# Patient Record
Sex: Female | Born: 1950 | Race: Black or African American | Hispanic: No | Smoking: Never smoker
Health system: Southern US, Community
[De-identification: ages and names within clinical notes are randomized; demographics above are authoritative.]

## PROBLEM LIST (undated history)

## (undated) DIAGNOSIS — E039 Hypothyroidism, unspecified: Secondary | ICD-10-CM

## (undated) DIAGNOSIS — M1990 Unspecified osteoarthritis, unspecified site: Secondary | ICD-10-CM

## (undated) DIAGNOSIS — U071 COVID-19: Secondary | ICD-10-CM

## (undated) DIAGNOSIS — C801 Malignant (primary) neoplasm, unspecified: Secondary | ICD-10-CM

## (undated) DIAGNOSIS — I10 Essential (primary) hypertension: Secondary | ICD-10-CM

## (undated) DIAGNOSIS — D649 Anemia, unspecified: Secondary | ICD-10-CM

## (undated) DIAGNOSIS — Z9889 Other specified postprocedural states: Secondary | ICD-10-CM

## (undated) DIAGNOSIS — I82409 Acute embolism and thrombosis of unspecified deep veins of unspecified lower extremity: Secondary | ICD-10-CM

## (undated) DIAGNOSIS — R112 Nausea with vomiting, unspecified: Secondary | ICD-10-CM

## (undated) DIAGNOSIS — R7303 Prediabetes: Secondary | ICD-10-CM

## (undated) HISTORY — PX: CHOLECYSTECTOMY: SHX55

## (undated) HISTORY — PX: THYROIDECTOMY: SHX17

---

## 2019-11-30 ENCOUNTER — Other Ambulatory Visit: Payer: Self-pay | Admitting: Pediatrics

## 2019-11-30 ENCOUNTER — Other Ambulatory Visit: Payer: Self-pay | Admitting: Podiatry

## 2019-12-23 NOTE — Patient Instructions (Signed)
Jordan [name]  12/23/2019     @PREFPERIOPPHARMACY @   Your procedure is scheduled on  12/28/2019 .  Report to [facility] at  [phone]   A.M.  Call this number if you have problems the morning of surgery:  [phone]   Remember:  Do not eat or drink after midnight.                       Take these medicines the morning of surgery with A SIP OF WATER  Levothyroxine, mobic(if needed).    Do not wear jewelry, make-up or nail polish.  Do not wear lotions, powders, or perfumes. Please wear deodorant and brush your teeth.  Do not shave 48 hours prior to surgery.  Men may shave face and neck.  Do not bring valuables to the hospital.  [facility] is not responsible for any belongings or valuables.  Contacts, dentures or bridgework may not be worn into surgery.  Leave your suitcase in the car.  After surgery it may be brought to your room.  For patients admitted to the hospital, discharge time will be determined by your treatment team.  Patients discharged the day of surgery will not be allowed to drive home.   Name and phone number of your driver:   family Special instructions:  DO NOT smoke the morning of your procedure.  Please read over the following fact sheets that you were given. Anesthesia Post-op Instructions and Care and Recovery After Surgery       Toe Deformity Repair, Care After This sheet gives you information about how to care for yourself after your procedure. Your health care provider may also give you more specific instructions. If you have problems or questions, contact your health care provider. What can I expect after the procedure? After the procedure, it is common to have:  Pain in the affected area.  Discomfort with walking. Follow these instructions at home: If you have a post-operative shoe:   Wear the shoe as told by your health care provider. Remove it only as told by your health care provider.  Loosen the shoe if your toes tingle,  become numb, or turn cold and blue.  Keep the shoe clean and dry. Bathing  Do not take baths, swim, or use a hot tub until your health care provider approves. Ask your health care provider if you can take showers. You may only be allowed to take sponge baths.  If your post-operative shoe is not waterproof, cover it with a watertight covering when you take a bath or a shower.  Keep the bandage (dressing) dry until your health care provider says it can be removed. Incision care   Follow instructions from your health care provider about how to take care of your incision. Make sure you: ? Wash your hands with soap and water before you change your dressing. If soap and water are not available, use hand sanitizer. ? Change your dressing as told by your health care provider. ? Leave stitches (sutures), skin glue, or adhesive strips in place. These skin closures may need to stay in place for 2 weeks or longer.  Check your incision area every day for signs of infection. Check for: ? Redness, swelling, or pain. ? Fluid or blood. ? Warmth. ? Pus or a bad smell. Managing pain, stiffness, and swelling   If directed, put ice on the affected area. ? Put ice in a plastic bag. ?  Place a towel between your skin and the bag. ? Leave the ice on for 20 minutes, 2-3 times a day.  Raise (elevate) the affected foot above the level of your heart while you are sitting or lying down. Driving  Do not drive or use heavy machinery while taking prescription pain medicine.  Ask your health care provider when it is safe to drive if you have a post-operative shoe on your foot. Activity  Walk and return to your normal activities as told by your health care provider. Ask your health care provider what activities are safe for you.  Do not use your affected foot to support your body weight until your health care provider says that you can. Use crutches as directed by your health care provider.  Do exercises as  told by your health care provider or physical therapist. General instructions  Take over-the-counter and prescription medicines only as told by your health care provider.  If you are taking prescription pain medicine, take actions to prevent or treat constipation. Your health care provider may recommend that you: ? Drink enough fluid to keep your urine pale yellow. ? Eat foods that are high in fiber, such as fresh fruits and vegetables, whole grains, and beans. ? Limit foods that are high in fat and processed sugars, such as fried or sweet foods. ? Take an over-the-counter or prescription medicine for constipation.  Do not use any products that contain nicotine or tobacco, such as cigarettes and e-cigarettes. These can delay bone healing. If you need help quitting, ask your health care provider.  Keep all follow-up visits as told by your health care provider. This is important. Contact a health care provider if:  You have redness, swelling, or pain at your incision site.  You have fluid or blood coming from your incision.  Your incision feels warm to the touch.  You have pus or a bad smell coming from the incision area or the dressing.  You have a fever. Get help right away if:  You develop a rash.  You have difficulty breathing. Summary  After the procedure, it is common to have pain in the affected area and discomfort with walking.  Follow instructions from your health care provider about how to take care of your incision.  Walk and return to your normal activities as told by your health care provider. Ask your health care provider what activities are safe for you.  Keep all follow-up visits as told by your health care provider. This information is not intended to replace advice given to you by your health care provider. Make sure you discuss any questions you have with your health care provider. Document Revised: 10/20/2018 Document Reviewed: 03/10/2017 Elsevier Patient  Education  2020 [place] After These instructions provide you with information about caring for yourself after your procedure. Your health care provider may also give you more specific instructions. Your treatment has been planned according to current medical practices, but problems sometimes occur. Call your health care provider if you have any problems or questions after your procedure. What can I expect after the procedure? After your procedure, you may:  Feel sleepy for several hours.  Feel clumsy and have poor balance for several hours.  Feel forgetful about what happened after the procedure.  Have poor judgment for several hours.  Feel nauseous or vomit.  Have a sore throat if you had a breathing tube during the procedure. Follow these instructions at home: For  at least 24 hours after the procedure:      Have a responsible adult stay with you. It is important to have someone help care for you until you are awake and alert.  Rest as needed.  Do not: ? Participate in activities in which you could fall or become injured. ? Drive. ? Use heavy machinery. ? Drink alcohol. ? Take sleeping pills or medicines that cause drowsiness. ? Make important decisions or sign legal documents. ? Take care of children on your own. Eating and drinking  Follow the diet that is recommended by your health care provider.  If you vomit, drink water, juice, or soup when you can drink without vomiting.  Make sure you have little or no nausea before eating solid foods. General instructions  Take over-the-counter and prescription medicines only as told by your health care provider.  If you have sleep apnea, surgery and certain medicines can increase your risk for breathing problems. Follow instructions from your health care provider about wearing your sleep device: ? Anytime you are sleeping, including during daytime naps. ? While taking prescription  pain medicines, sleeping medicines, or medicines that make you drowsy.  If you smoke, do not smoke without supervision.  Keep all follow-up visits as told by your health care provider. This is important. Contact a health care provider if:  You keep feeling nauseous or you keep vomiting.  You feel light-headed.  You develop a rash.  You have a fever. Get help right away if:  You have trouble breathing. Summary  For several hours after your procedure, you may feel sleepy and have poor judgment.  Have a responsible adult stay with you for at least 24 hours or until you are awake and alert. This information is not intended to replace advice given to you by your health care provider. Make sure you discuss any questions you have with your health care provider. Document Revised: 09/28/2017 Document Reviewed: 10/21/2015 Elsevier Patient Education  [place]. How to Use Chlorhexidine for Bathing Chlorhexidine gluconate (CHG) is a germ-killing (antiseptic) solution that is used to clean the skin. It can get rid of the bacteria that normally live on the skin and can keep them away for about 24 hours. To clean your skin with CHG, you may be given:  A CHG solution to use in the shower or as part of a sponge bath.  A prepackaged cloth that contains CHG. Cleaning your skin with CHG may help lower the risk for infection:  While you are staying in the intensive care unit of the hospital.  If you have a vascular access, such as a central line, to provide short-term or long-term access to your veins.  If you have a catheter to drain urine from your bladder.  If you are on a ventilator. A ventilator is a machine that helps you breathe by moving air in and out of your lungs.  After surgery. What are the risks? Risks of using CHG include:  A skin reaction.  Hearing loss, if CHG gets in your ears.  [name] injury, if CHG gets in your eyes and is not rinsed out.  The CHG product  catching fire. Make sure that you avoid smoking and flames after applying CHG to your skin. Do not use CHG:  If you have a chlorhexidine allergy or have previously reacted to chlorhexidine.  On babies younger than 15 months of age. How to use CHG solution  Use CHG only as told by your health  care provider, and follow the instructions on the label.  Use the full amount of CHG as directed. Usually, this is one bottle. During a shower Follow these steps when using CHG solution during a shower (unless your health care provider gives you different instructions): 1. Start the shower. 2. Use your normal soap and shampoo to wash your face and hair. 3. Turn off the shower or move out of the shower stream. 4. Pour the CHG onto a clean washcloth. Do not use any type of brush or rough-edged sponge. 5. Starting at your neck, lather your body down to your toes. Make sure you follow these instructions: ? If you will be having surgery, pay special attention to the part of your body where you will be having surgery. Scrub this area for at least 1 minute. ? Do not use CHG on your head or face. If the solution gets into your ears or eyes, rinse them well with water. ? Avoid your genital area. ? Avoid any areas of skin that have broken skin, cuts, or scrapes. ? Scrub your back and under your arms. Make sure to wash skin folds. 6. Let the lather sit on your skin for 1-2 minutes or as long as told by your health care provider. 7. Thoroughly rinse your entire body in the shower. Make sure that all body creases and crevices are rinsed well. 8. Dry off with a clean towel. Do not put any substances on your body afterward--such as powder, lotion, or perfume--unless you are told to do so by your health care provider. Only use lotions that are recommended by the manufacturer. 9. Put on clean clothes or pajamas. 10. If it is the night before your surgery, sleep in clean sheets.  During a sponge bath Follow these  steps when using CHG solution during a sponge bath (unless your health care provider gives you different instructions): 1. Use your normal soap and shampoo to wash your face and hair. 2. Pour the CHG onto a clean washcloth. 3. Starting at your neck, lather your body down to your toes. Make sure you follow these instructions: ? If you will be having surgery, pay special attention to the part of your body where you will be having surgery. Scrub this area for at least 1 minute. ? Do not use CHG on your head or face. If the solution gets into your ears or eyes, rinse them well with water. ? Avoid your genital area. ? Avoid any areas of skin that have broken skin, cuts, or scrapes. ? Scrub your back and under your arms. Make sure to wash skin folds. 4. Let the lather sit on your skin for 1-2 minutes or as long as told by your health care provider. 5. Using a different clean, wet washcloth, thoroughly rinse your entire body. Make sure that all body creases and crevices are rinsed well. 6. Dry off with a clean towel. Do not put any substances on your body afterward--such as powder, lotion, or perfume--unless you are told to do so by your health care provider. Only use lotions that are recommended by the manufacturer. 7. Put on clean clothes or pajamas. 8. If it is the night before your surgery, sleep in clean sheets. How to use CHG prepackaged cloths  Only use CHG cloths as told by your health care provider, and follow the instructions on the label.  Use the CHG cloth on clean, dry skin.  Do not use the CHG cloth on your head or face  unless your health care provider tells you to.  When washing with the CHG cloth: ? Avoid your genital area. ? Avoid any areas of skin that have broken skin, cuts, or scrapes. Before surgery Follow these steps when using a CHG cloth to clean before surgery (unless your health care provider gives you different instructions): 1. Using the CHG cloth, vigorously scrub the  part of your body where you will be having surgery. Scrub using a back-and-forth motion for 3 minutes. The area on your body should be completely wet with CHG when you are done scrubbing. 2. Do not rinse. Discard the cloth and let the area air-dry. Do not put any substances on the area afterward, such as powder, lotion, or perfume. 3. Put on clean clothes or pajamas. 4. If it is the night before your surgery, sleep in clean sheets.  For general bathing Follow these steps when using CHG cloths for general bathing (unless your health care provider gives you different instructions). 1. Use a separate CHG cloth for each area of your body. Make sure you wash between any folds of skin and between your fingers and toes. Wash your body in the following order, switching to a new cloth after each step: ? The front of your neck, shoulders, and chest. ? Both of your arms, under your arms, and your hands. ? Your stomach and groin area, avoiding the genitals. ? Your right leg and foot. ? Your left leg and foot. ? The back of your neck, your back, and your buttocks. 2. Do not rinse. Discard the cloth and let the area air-dry. Do not put any substances on your body afterward--such as powder, lotion, or perfume--unless you are told to do so by your health care provider. Only use lotions that are recommended by the manufacturer. 3. Put on clean clothes or pajamas. Contact a health care provider if:  Your skin gets irritated after scrubbing.  You have questions about using your solution or cloth. Get help right away if:  Your eyes become very red or swollen.  Your eyes itch badly.  Your skin itches badly and is red or swollen.  Your hearing changes.  You have trouble seeing.  You have swelling or tingling in your mouth or throat.  You have trouble breathing.  You swallow any chlorhexidine. Summary  Chlorhexidine gluconate (CHG) is a germ-killing (antiseptic) solution that is used to clean the  skin. Cleaning your skin with CHG may help to lower your risk for infection.  You may be given CHG to use for bathing. It may be in a bottle or in a prepackaged cloth to use on your skin. Carefully follow your health care provider's instructions and the instructions on the product label.  Do not use CHG if you have a chlorhexidine allergy.  Contact your health care provider if your skin gets irritated after scrubbing. This information is not intended to replace advice given to you by your health care provider. Make sure you discuss any questions you have with your health care provider. Document Revised: 09/16/2018 Document Reviewed: 05/28/2017 Elsevier Patient Education  [place].

## 2019-12-26 ENCOUNTER — Encounter (HOSPITAL_COMMUNITY): Payer: Self-pay

## 2019-12-26 ENCOUNTER — Ambulatory Visit (HOSPITAL_COMMUNITY)
Admission: RE | Admit: 2019-12-26 | Discharge: 2019-12-26 | Disposition: A | Discharge status: Home / Self Care | Payer: Medicare Other | Source: Ambulatory Visit | Attending: Podiatry | Admitting: Podiatry

## 2019-12-26 ENCOUNTER — Other Ambulatory Visit: Payer: Self-pay

## 2019-12-26 ENCOUNTER — Encounter (HOSPITAL_COMMUNITY)
Admission: RE | Admit: 2019-12-26 | Discharge: 2019-12-26 | Disposition: A | Discharge status: Home / Self Care | Payer: Medicare Other | Source: Ambulatory Visit | Attending: Podiatry | Admitting: Podiatry

## 2019-12-26 ENCOUNTER — Other Ambulatory Visit (HOSPITAL_COMMUNITY): Payer: Self-pay | Admitting: Podiatry

## 2019-12-26 ENCOUNTER — Other Ambulatory Visit (HOSPITAL_COMMUNITY)
Admission: RE | Admit: 2019-12-26 | Discharge: 2019-12-26 | Disposition: A | Discharge status: Home / Self Care | Payer: Medicare Other | Source: Ambulatory Visit | Attending: Podiatry | Admitting: Podiatry

## 2019-12-26 DIAGNOSIS — M19071 Primary osteoarthritis, right ankle and foot: Secondary | ICD-10-CM | POA: Insufficient documentation

## 2019-12-26 DIAGNOSIS — M2042 Other hammer toe(s) (acquired), left foot: Secondary | ICD-10-CM

## 2019-12-26 DIAGNOSIS — M2041 Other hammer toe(s) (acquired), right foot: Secondary | ICD-10-CM

## 2019-12-26 DIAGNOSIS — Z01812 Encounter for preprocedural laboratory examination: Secondary | ICD-10-CM | POA: Insufficient documentation

## 2019-12-26 DIAGNOSIS — Z20822 Contact with and (suspected) exposure to covid-19: Secondary | ICD-10-CM | POA: Insufficient documentation

## 2019-12-26 DIAGNOSIS — M19072 Primary osteoarthritis, left ankle and foot: Secondary | ICD-10-CM | POA: Insufficient documentation

## 2019-12-26 HISTORY — DX: Other specified postprocedural states: Z98.890

## 2019-12-26 HISTORY — DX: Other specified postprocedural states: R11.2

## 2019-12-26 HISTORY — DX: Hypothyroidism, unspecified: E03.9

## 2019-12-26 HISTORY — DX: Anemia, unspecified: D64.9

## 2019-12-26 HISTORY — DX: Essential (primary) hypertension: I10

## 2019-12-26 HISTORY — DX: Unspecified osteoarthritis, unspecified site: M19.90

## 2019-12-27 LAB — SARS CORONAVIRUS 2 (TAT 6-24 HRS): SARS Coronavirus 2: NEGATIVE

## 2019-12-28 ENCOUNTER — Other Ambulatory Visit: Payer: Self-pay

## 2019-12-28 ENCOUNTER — Encounter (HOSPITAL_COMMUNITY): Payer: Self-pay | Admitting: *Deleted

## 2019-12-28 ENCOUNTER — Ambulatory Visit (HOSPITAL_COMMUNITY)
Admission: RE | Admit: 2019-12-28 | Discharge: 2019-12-28 | Disposition: A | Discharge status: Home / Self Care | Payer: Medicare Other | Attending: Podiatry | Admitting: Podiatry

## 2019-12-28 ENCOUNTER — Ambulatory Visit (HOSPITAL_COMMUNITY): Payer: Medicare Other | Admitting: Anesthesiology

## 2019-12-28 ENCOUNTER — Encounter (HOSPITAL_COMMUNITY)
Admission: RE | Disposition: A | Discharge status: Home / Self Care | Payer: Self-pay | Source: Home / Self Care | Attending: Podiatry

## 2019-12-28 ENCOUNTER — Ambulatory Visit (HOSPITAL_COMMUNITY): Payer: Medicare Other

## 2019-12-28 DIAGNOSIS — M2042 Other hammer toe(s) (acquired), left foot: Secondary | ICD-10-CM | POA: Insufficient documentation

## 2019-12-28 DIAGNOSIS — Z79899 Other long term (current) drug therapy: Secondary | ICD-10-CM | POA: Diagnosis not present

## 2019-12-28 DIAGNOSIS — Z9889 Other specified postprocedural states: Secondary | ICD-10-CM

## 2019-12-28 DIAGNOSIS — E890 Postprocedural hypothyroidism: Secondary | ICD-10-CM | POA: Diagnosis not present

## 2019-12-28 DIAGNOSIS — Z833 Family history of diabetes mellitus: Secondary | ICD-10-CM | POA: Insufficient documentation

## 2019-12-28 DIAGNOSIS — Z9049 Acquired absence of other specified parts of digestive tract: Secondary | ICD-10-CM | POA: Diagnosis not present

## 2019-12-28 DIAGNOSIS — I10 Essential (primary) hypertension: Secondary | ICD-10-CM | POA: Insufficient documentation

## 2019-12-28 DIAGNOSIS — D649 Anemia, unspecified: Secondary | ICD-10-CM | POA: Diagnosis not present

## 2019-12-28 DIAGNOSIS — Z85850 Personal history of malignant neoplasm of thyroid: Secondary | ICD-10-CM | POA: Insufficient documentation

## 2019-12-28 DIAGNOSIS — M2041 Other hammer toe(s) (acquired), right foot: Secondary | ICD-10-CM | POA: Insufficient documentation

## 2019-12-28 HISTORY — PX: FLEXOR TENOTOMY: SHX6342

## 2019-12-28 HISTORY — PX: TOE ARTHROPLASTY: SHX6504

## 2019-12-28 HISTORY — PX: REPAIR EXTENSOR TENDON: SHX5382

## 2019-12-28 SURGERY — ARTHROPLASTY, TOE
Anesthesia: General | Site: Toe | Laterality: Left

## 2019-12-28 MED ORDER — ORAL CARE MOUTH RINSE: 15.0000 mL | Freq: Once | OROMUCOSAL | Status: AC

## 2019-12-28 MED ORDER — FENTANYL CITRATE (PF) 100 MCG/2ML IJ SOLN
INTRAMUSCULAR | Status: AC
  Filled 2019-12-28: qty 2

## 2019-12-28 MED ORDER — IBUPROFEN 800 MG PO TABS
800.0000 mg | ORAL_TABLET | Freq: Once | ORAL | Status: AC
  Administered 2019-12-28: 800 mg via ORAL
  Filled 2019-12-28: qty 1

## 2019-12-28 MED ORDER — LACTATED RINGERS IV SOLN: INTRAVENOUS | Status: DC

## 2019-12-28 MED ORDER — FENTANYL CITRATE (PF) 100 MCG/2ML IJ SOLN: 25.0000 ug | INTRAMUSCULAR | Status: DC | PRN

## 2019-12-28 MED ORDER — MIDAZOLAM HCL 5 MG/5ML IJ SOLN
INTRAMUSCULAR | Status: DC | PRN
  Administered 2019-12-28: 2 mg via INTRAVENOUS

## 2019-12-28 MED ORDER — CHLORHEXIDINE GLUCONATE 0.12 % MT SOLN
15.0000 mL | Freq: Once | OROMUCOSAL | Status: AC
  Administered 2019-12-28: 15 mL via OROMUCOSAL

## 2019-12-28 MED ORDER — PROPOFOL 500 MG/50ML IV EMUL
INTRAVENOUS | Status: DC | PRN
  Administered 2019-12-28: 50 ug/kg/min via INTRAVENOUS

## 2019-12-28 MED ORDER — MIDAZOLAM HCL 2 MG/2ML IJ SOLN
INTRAMUSCULAR | Status: AC
  Filled 2019-12-28: qty 2

## 2019-12-28 MED ORDER — PROPOFOL 10 MG/ML IV BOLUS
INTRAVENOUS | Status: DC | PRN
  Administered 2019-12-28: 40 mg via INTRAVENOUS
  Administered 2019-12-28: 30 mg via INTRAVENOUS
  Administered 2019-12-28 (×3): 20 mg via INTRAVENOUS

## 2019-12-28 MED ORDER — LIDOCAINE HCL 1 % IJ SOLN
INTRAMUSCULAR | Status: DC | PRN
  Administered 2019-12-28: 10 mL via INTRAMUSCULAR

## 2019-12-28 MED ORDER — ONDANSETRON HCL 4 MG/2ML IJ SOLN
INTRAMUSCULAR | Status: DC | PRN
  Administered 2019-12-28: 4 mg via INTRAVENOUS

## 2019-12-28 MED ORDER — PROPOFOL 10 MG/ML IV BOLUS
INTRAVENOUS | Status: AC
  Filled 2019-12-28: qty 20

## 2019-12-28 MED ORDER — SODIUM CHLORIDE 0.9 % IR SOLN
Status: DC | PRN
  Administered 2019-12-28: 1000 mL

## 2019-12-28 MED ORDER — LIDOCAINE HCL (PF) 1 % IJ SOLN
INTRAMUSCULAR | Status: AC
  Filled 2019-12-28: qty 30

## 2019-12-28 MED ORDER — ONDANSETRON HCL 4 MG/2ML IJ SOLN
INTRAMUSCULAR | Status: AC
  Filled 2019-12-28: qty 2

## 2019-12-28 MED ORDER — FENTANYL CITRATE (PF) 100 MCG/2ML IJ SOLN
INTRAMUSCULAR | Status: DC | PRN
  Administered 2019-12-28 (×3): 25 ug via INTRAVENOUS

## 2019-12-28 MED ORDER — ONDANSETRON HCL 4 MG/2ML IJ SOLN: 4.0000 mg | Freq: Once | INTRAMUSCULAR | Status: DC | PRN

## 2019-12-28 MED ORDER — DEXAMETHASONE SODIUM PHOSPHATE 4 MG/ML IJ SOLN
INTRAMUSCULAR | Status: AC
  Filled 2019-12-28: qty 1

## 2019-12-28 MED ORDER — CLINDAMYCIN PHOSPHATE 600 MG/50ML IV SOLN
INTRAVENOUS | Status: DC | PRN
  Administered 2019-12-28: 600 mg via INTRAVENOUS

## 2019-12-28 MED ORDER — CLINDAMYCIN PHOSPHATE 600 MG/50ML IV SOLN
INTRAVENOUS | Status: AC
  Filled 2019-12-28: qty 50

## 2019-12-28 MED ORDER — BUPIVACAINE HCL (PF) 0.5 % IJ SOLN
INTRAMUSCULAR | Status: AC
  Filled 2019-12-28: qty 30

## 2019-12-28 MED ORDER — PROPOFOL 10 MG/ML IV BOLUS
INTRAVENOUS | Status: AC
  Filled 2019-12-28: qty 40

## 2019-12-28 SURGICAL SUPPLY — 40 items
BANDAGE ELASTIC 4 VELCRO NS (GAUZE/BANDAGES/DRESSINGS) ×4 IMPLANT
BANDAGE ESMARK 4X12 BL STRL LF (DISPOSABLE) ×2 IMPLANT
BENZOIN TINCTURE PRP APPL 2/3 (GAUZE/BANDAGES/DRESSINGS) ×6 IMPLANT
BLADE OSC/SAGITTAL MD 5.5X18 (BLADE) ×2 IMPLANT
BLADE SURG 15 STRL LF DISP TIS (BLADE) ×4 IMPLANT
BLADE SURG 15 STRL SS (BLADE) ×2
BNDG CONFORM 2 STRL LF (GAUZE/BANDAGES/DRESSINGS) ×6 IMPLANT
BNDG ESMARK 4X12 BLUE STRL LF (DISPOSABLE) ×8
BNDG GAUZE ELAST 4 BULKY (GAUZE/BANDAGES/DRESSINGS) ×10 IMPLANT
CHLORAPREP W/TINT 26 (MISCELLANEOUS) ×6 IMPLANT
CLOSURE WOUND 1/2 X4 (GAUZE/BANDAGES/DRESSINGS) ×4
CLOTH BEACON ORANGE TIMEOUT ST (SAFETY) ×4 IMPLANT
COVER LIGHT HANDLE STERIS (MISCELLANEOUS) ×8 IMPLANT
COVER WAND RF STERILE (DRAPES) ×4 IMPLANT
CUFF TOURN SGL QUICK 18X4 (TOURNIQUET CUFF) ×4 IMPLANT
DECANTER SPIKE VIAL GLASS SM (MISCELLANEOUS) ×8 IMPLANT
DRAPE EXTREMITY BILATERAL (DRAPES) ×2 IMPLANT
DRSG ADAPTIC 3X8 NADH LF (GAUZE/BANDAGES/DRESSINGS) ×4 IMPLANT
ELECT REM PT RETURN 9FT ADLT (ELECTROSURGICAL) ×4
ELECTRODE REM PT RTRN 9FT ADLT (ELECTROSURGICAL) ×2 IMPLANT
GAUZE SPONGE 4X4 12PLY STRL (GAUZE/BANDAGES/DRESSINGS) ×4 IMPLANT
GLOVE BIO SURGEON STRL SZ7.5 (GLOVE) ×4 IMPLANT
GLOVE ECLIPSE 7.0 STRL STRAW (GLOVE) ×10 IMPLANT
GOWN STRL REUS W/ TWL LRG LVL3 (GOWN DISPOSABLE) ×2 IMPLANT
GOWN STRL REUS W/TWL LRG LVL3 (GOWN DISPOSABLE) ×8 IMPLANT
KIT TURNOVER KIT A (KITS) ×4 IMPLANT
MANIFOLD NEPTUNE II (INSTRUMENTS) ×4 IMPLANT
NDL HYPO 25X1 1.5 SAFETY (NEEDLE) ×4 IMPLANT
NEEDLE HYPO 25X1 1.5 SAFETY (NEEDLE) ×8 IMPLANT
NS IRRIG 1000ML POUR BTL (IV SOLUTION) ×4 IMPLANT
PACK BASIC LIMB (CUSTOM PROCEDURE TRAY) ×4 IMPLANT
PAD ARMBOARD 7.5X6 YLW CONV (MISCELLANEOUS) ×4 IMPLANT
SET BASIN LINEN APH (SET/KITS/TRAYS/PACK) ×4 IMPLANT
STOCKINETTE IMPERVIOUS LG (DRAPES) ×2 IMPLANT
STRIP CLOSURE SKIN 1/2X4 (GAUZE/BANDAGES/DRESSINGS) ×6 IMPLANT
SUT ETHILON 4 0 PS 2 18 (SUTURE) ×6 IMPLANT
SUT VIC AB 2-0 CT2 27 (SUTURE) ×4 IMPLANT
SUT VICRYL 4-0 PS2 18IN ABS (SUTURE) ×2 IMPLANT
SUT VICRYL AB 3-0 FS1 BRD 27IN (SUTURE) ×2 IMPLANT
SYR CONTROL 10ML LL (SYRINGE) ×8 IMPLANT

## 2019-12-28 NOTE — Brief Op Note (Signed)
12/28/2019  9:09 AM  PATIENT:  Jordan [name]  69 y.o. female  PRE-OPERATIVE DIAGNOSIS:  HAMMER TOE SECOND DIGIT RIGHT FOOT; HAMMER TOE SECOND DIGIT LEFT FOOT  POST-OPERATIVE DIAGNOSIS:  HAMMER TOE SECOND DIGIT RIGHT FOOT; HAMMER TOE SECOND DIGIT LEFT FOOT  PROCEDURE:  Procedure(s): TOE ARTHROPLASTY RIGHT SECOND TOE AND LEFT SECOND TOE (Bilateral) EXTENSOR TENDON LENGTHENING (Left) FLEXOR TENOTOMY (Left)  SURGEON:  Surgeon(s) and Role:    * Tyson Babinski, DPM - Primary  PHYSICIAN ASSISTANT: none.  ASSISTANTS: none   ANESTHESIA:   local and MAC  EBL:  None.  BLOOD ADMINISTERED:none  DRAINS: none   LOCAL MEDICATIONS USED:  MARCAINE   , LIDOCAINE  and Amount: 10 ml each foot  SPECIMEN:  No Specimen  DISPOSITION OF SPECIMEN:  N/A  COUNTS:  YES  TOURNIQUET:   Total Tourniquet Time Documented: Calf (N/A) - 33 minutes Calf (N/A) - 32 minutes Total: Calf (N/A) - 65 minutes   DICTATION: .Viviann Spare Dictation  PLAN OF CARE: Discharge to home after PACU  PATIENT DISPOSITION:  PACU - hemodynamically stable.   Delay start of Pharmacological VTE agent (>24hrs) due to surgical blood loss or risk of bleeding: not applicable

## 2019-12-28 NOTE — Op Note (Signed)
PATIENT:  Jordan [name]  69 y.o. female  PRE-OPERATIVE DIAGNOSIS:  HAMMER TOE SECOND DIGIT RIGHT FOOT; HAMMER TOE SECOND DIGIT LEFT FOOT  POST-OPERATIVE DIAGNOSIS:  HAMMER TOE SECOND DIGIT RIGHT FOOT; HAMMER TOE SECOND DIGIT LEFT FOOT  PROCEDURE:  Procedure(s): TOE ARTHROPLASTY RIGHT SECOND TOE AND LEFT SECOND TOE (Bilateral) EXTENSOR TENDON LENGTHENING (Left) FLEXOR TENOTOMY (Left)  SURGEON:  Surgeon(s) and Role:    Tyson Babinski, DPM - Primary  ASSISTANTS: none   ANESTHESIA:   local and MAC  EBL:  None.  BLOOD ADMINISTERED:none  Materials: 2-0 Vicryl, 3-0 Vicryl, 4-0 nylon.   LOCAL MEDICATIONS USED:  MARCAINE   , LIDOCAINE  and Amount: 10 ml each foot  SPECIMEN:  No Specimen  TOURNIQUET:   Total Tourniquet Time Documented: Calf (N/A) - 33 minutes Calf (N/A) - 32 minutes Total: Calf (N/A) - 65 minutes  Patient was brought into the operating room laid supine on the operating table. Ankle tourniquet was applied to the both surgical extremity. Following IV sedation, a local block was achieved using 10 cc of mixture of 1% plain lidocaine with 0.5% marcaine in the right foot. Both feet were the prepped, scrubbed and draped in aseptic manner. Using an esmarch band the tourniquet on the surgical site was inflatted at 256mHG on the right side.   Attention was directed over the right second proximal interphalangeal joint. Semi elliptical incision was marked over the hyperkeratotic lesion. Using skin blade incision was made and skin wedge was removed. Care was made to retract all neurovascular bundle. At this time using deep blade, transverse tenotomy of the extenson tendon was performed. The head of the proximal phalanx was freed and using a saw, the head of proximal phalanx was removed. Rasp was used to rasp down any sharp edges. The tendon was reapproximated back and repaired using 2-0 Vicryl. The subcutaneous tissue was closed using 3-0 Vicryl. Skin was closed using 4-0 nylon.  The tourniquet on the right side was deflated.   Attention was directed towards the left foot second toe. The left second toe is contracte at the MPJ and PIPJ and DIPJ. There is a hyperkeratotic lesion noted at the PIPJ. A local black was achieved using 10cc of mixture of 1% plain lidocaine with 0.5% marcaine in the left second toe. Using an esmarch band the tourniquet on the surgical site was inflatted at 2567mG on the left side.   Attention was directed over the Left second proximal interphalangeal joint. A semi elliptical  6cm incision was marked over the 2nd toe starting from PIPJ to the MPJ level. Using #15 blade skin incision was made and skin wedge was removed. The incision was then deepened to the level of the extensor tendon. Care was made to retract all neurovascular bundle. At this time transverse tenotomy of the extenson tendon was performed at the proximal phalanx head. The tendon was reflected back all the way to the MPJ level. The extensor hood, was released along with extensor wing from the base of the proximal phalanx. At this time the head of the proximal phalanx was freed and using a saw, the head of proximal phalanx was removed.  Z lengthening of the extensor tendon was performed and then tendon was repaired using 2-0 Vicryl. Subcuticular stiches were done using 3-0 Vicryl. Additionally the skin was reinforced with 3-0 nylon. At this decision was made to do flexor tenotomy due to DIPJ was still contracted. A percutaneous flexor tenotomy was performed using separate incision on the plantar aspect  of the left second toe.  Using a #15 blade, incision was made on the plantar aspect of the 2nd DIPJ. The flexor tendon was isolated and transected. Skin was repaired using 3-0 nylon. The toe was more rectus at the DIPJ.  The tourniquet on the right side was deflated.   Dry sterile dressing applied on both feet. Patient transferred to PACU with vital signs stable.

## 2019-12-28 NOTE — Discharge Instructions (Signed)
General Anesthesia, Adult, Care After This sheet gives you information about how to care for yourself after your procedure. Your health care provider may also give you more specific instructions. If you have problems or questions, contact your health care provider. What can I expect after the procedure? After the procedure, the following side effects are common:  Pain or discomfort at the IV site.  Nausea.  Vomiting.  Sore throat.  Trouble concentrating.  Feeling cold or chills.  Weak or tired.  Sleepiness and fatigue.  Soreness and body aches. These side effects can affect parts of the body that were not involved in surgery. Follow these instructions at home:  For at least 24 hours after the procedure:  Have a responsible adult stay with you. It is important to have someone help care for you until you are awake and alert.  Rest as needed.  Do not: ? Participate in activities in which you could fall or become injured. ? Drive. ? Use heavy machinery. ? Drink alcohol. ? Take sleeping pills or medicines that cause drowsiness. ? Make important decisions or sign legal documents. ? Take care of children on your own. Eating and drinking  Follow any instructions from your health care provider about eating or drinking restrictions.  When you feel hungry, start by eating small amounts of foods that are soft and easy to digest (bland), such as toast. Gradually return to your regular diet.  Drink enough fluid to keep your urine pale yellow.  If you vomit, rehydrate by drinking water, juice, or clear broth. General instructions  If you have sleep apnea, surgery and certain medicines can increase your risk for breathing problems. Follow instructions from your health care provider about wearing your sleep device: ? Anytime you are sleeping, including during daytime naps. ? While taking prescription pain medicines, sleeping medicines, or medicines that make you drowsy.  Return to  your normal activities as told by your health care provider. Ask your health care provider what activities are safe for you.  Take over-the-counter and prescription medicines only as told by your health care provider.  If you smoke, do not smoke without supervision.  Keep all follow-up visits as told by your health care provider. This is important. Contact a health care provider if:  You have nausea or vomiting that does not get better with medicine.  You cannot eat or drink without vomiting.  You have pain that does not get better with medicine.  You are unable to pass urine.  You develop a skin rash.  You have a fever.  You have redness around your IV site that gets worse. Get help right away if:  You have difficulty breathing.  You have chest pain.  You have blood in your urine or stool, or you vomit blood. Summary  After the procedure, it is common to have a sore throat or nausea. It is also common to feel tired.  Have a responsible adult stay with you for the first 24 hours after general anesthesia. It is important to have someone help care for you until you are awake and alert.  When you feel hungry, start by eating small amounts of foods that are soft and easy to digest (bland), such as toast. Gradually return to your regular diet.  Drink enough fluid to keep your urine pale yellow.  Return to your normal activities as told by your health care provider. Ask your health care provider what activities are safe for you. This information is not   intended to replace advice given to you by your health care provider. Make sure you discuss any questions you have with your health care provider. Document Revised: 07/03/2017 Document Reviewed: 02/13/2017 Elsevier Patient Education  [employer]. .These instructions will give you an idea of what to expect after surgery and how to manage issues that may arise before your first post op office visit.  Pain Management Pain is  best managed by "staying ahead" of it. If pain gets out of control, it is difficult to get it back under control. Local anesthesia that lasts 6-8 hours is used to numb the foot and decrease pain.  For the best pain control, take the pain medication every 4 hours for the first 2 days post op. On the third day pain medication can be taken as needed.   Post Op Nausea Nausea is common after surgery, so it is managed proactively.  If prescribed, use the prescribed nausea medication regularly for the first 2 days post op.  Bandages Do not worry if there is blood on the bandage. What looks like a lot of blood on the bandage is actually a small amount. Blood on the dressing spreads out as it is absorbed by the gauze, the same way a drop of water spreads out on a paper towel.  If the bandages feel wet or dry, stiff and uncomfortable, call the office during office hours and we will schedule a time for you to have the bandage changed.  Unless you are specifically told otherwise, we will do the first bandage change in the office.  Keep your bandage dry. If the bandage becomes wet or soiled, notify the office and we will schedule a time to change the bandage.  Activity It is best to spend most of the first 2 days after surgery lying down with the foot elevated above the level of your heart. You may put weight on your heel while wearing the surgical shoe.   You may only get up to go to the restroom.  Driving Do not drive until you are able to respond in an emergency (i.e. slam on the brakes). This usually occurs after the bone has healed - 6 to 8 weeks.  Call the Office If you have a fever over 101F.  If you have increasing pain after the initial post op pain has settled down.  If you have increasing redness, swelling, or drainage.  If you have any questions or concerns.

## 2019-12-28 NOTE — Anesthesia Preprocedure Evaluation (Signed)
Anesthesia Evaluation  Patient identified by MRN, date of birth, ID band Patient awake    Reviewed: Allergy & Precautions, H&P , NPO status , Patient's Chart, lab work & pertinent test results, reviewed documented beta blocker date and time   History of Anesthesia Complications (+) PONV and history of anesthetic complications  Airway Mallampati: II  TM Distance: >3 FB Neck ROM: full    Dental no notable dental hx. (+) Teeth Intact   Pulmonary neg pulmonary ROS,    Pulmonary exam normal breath sounds clear to auscultation       Cardiovascular Exercise Tolerance: Good hypertension, negative cardio ROS   Rhythm:regular Rate:Normal     Neuro/Psych negative neurological ROS  negative psych ROS   GI/Hepatic negative GI ROS, Neg liver ROS,   Endo/Other  negative endocrine ROS  Renal/GU negative Renal ROS  negative genitourinary   Musculoskeletal   Abdominal   Peds  Hematology  (+) Blood dyscrasia, anemia ,   Anesthesia Other Findings   Reproductive/Obstetrics negative OB ROS                             Anesthesia Physical Anesthesia Plan  ASA: II  Anesthesia Plan: General   Post-op Pain Management:    Induction:   PONV Risk Score and Plan: 4 or greater and Ondansetron  Airway Management Planned:   Additional Equipment:   Intra-op Plan:   Post-operative Plan:   Informed Consent: I have reviewed the patients History and Physical, chart, labs and discussed the procedure including the risks, benefits and alternatives for the proposed anesthesia with the patient or authorized representative who has indicated his/her understanding and acceptance.     Dental Advisory Given  Plan Discussed with: CRNA  Anesthesia Plan Comments:         Anesthesia Quick Evaluation

## 2019-12-28 NOTE — Transfer of Care (Signed)
Immediate Anesthesia Transfer of Care Note  Patient: Jordan [name]  Procedure(s) Performed: TOE ARTHROPLASTY RIGHT SECOND TOE AND LEFT SECOND TOE (Bilateral Toe) EXTENSOR TENDON LENGTHENING (Left ) FLEXOR TENOTOMY (Left )  Patient Location: PACU  Anesthesia Type:General  Level of Consciousness: awake  Airway & Oxygen Therapy: Patient Spontanous Breathing  Post-op Assessment: Report given to RN  Post vital signs: Reviewed and stable  Last Vitals:  Vitals Value Taken Time  BP 117/73 12/28/19 0911  Temp    Pulse 66 12/28/19 0913  Resp 13 12/28/19 0913  SpO2 100 % 12/28/19 0913  Vitals shown include unvalidated device data.  Last Pain:  Vitals:   12/28/19 0642  TempSrc: Oral      Patients Stated Pain Goal: 8 (44/81/85 6314)  Complications: No complications documented.

## 2019-12-28 NOTE — H&P (Signed)
.   HISTORY AND PHYSICAL INTERVAL NOTE:  12/28/2019  7:01 AM  Jordan [name]  has presented today for surgery, with the diagnosis of HAMMER TOE SECOND DIGIT RIGHT FOOT; HAMMER TOE SECOND DIGIT LEFT FOOT.  The various methods of treatment have been discussed with the patient.  No guarantees were given.  After consideration of risks, benefits and other options for treatment, the patient has consented to surgery.  I have reviewed the patients' chart and labs.    Patient Vitals for the past 24 hrs:  BP Temp Temp src Pulse Resp SpO2  12/28/19 0642 128/80 98.1 F (36.7 C) Oral 62 11 96 %    A history and physical examination was performed in my office.  The patient was reexamined.  There have been no changes to this history and physical examination.  Jordan [name], DPM

## 2019-12-28 NOTE — Anesthesia Postprocedure Evaluation (Signed)
Anesthesia Post Note  Patient: Public house manager  Procedure(s) Performed: TOE ARTHROPLASTY RIGHT SECOND TOE AND LEFT SECOND TOE (Bilateral Toe) EXTENSOR TENDON LENGTHENING (Left ) FLEXOR TENOTOMY (Left )  Anesthesia Type: General Level of consciousness: awake Pain management: pain level controlled Vital Signs Assessment: post-procedure vital signs reviewed and stable Respiratory status: spontaneous breathing Cardiovascular status: blood pressure returned to baseline Anesthetic complications: no   No complications documented.   Last Vitals:  Vitals:   12/28/19 1011 12/28/19 1014  BP: 139/71   Pulse: 60   Resp: 11   Temp:  (!) 36.4 C  SpO2: 97%     Last Pain:  Vitals:   12/28/19 1014  TempSrc: Oral  PainSc:                  Louann Sjogren

## 2019-12-28 NOTE — Addendum Note (Signed)
Addendum  created 12/28/19 1407 by Ollen Bowl, CRNA   Charge Capture section accepted

## 2019-12-29 ENCOUNTER — Encounter (HOSPITAL_COMMUNITY): Payer: Self-pay | Admitting: Podiatry

## 2020-07-25 NOTE — H&P (Signed)
TOTAL KNEE ADMISSION H&P  Patient is being admitted for right total knee arthroplasty.  Subjective:  Chief Complaint: Right knee pain.  HPI: Jordan [name], 70 y.o. female has a history of pain and functional disability in the right knee due to arthritis and has failed non-surgical conservative treatments for greater than 12 weeks to include NSAID's and/or analgesics, corticosteriod injections and activity modification. Onset of symptoms was gradual, starting several years ago with gradually worsening course since that time. The patient noted no past surgery on the right knee.  Patient currently rates pain in the right knee at 5 out of 10 with activity. Patient has worsening of pain with activity and weight bearing. AP and lateral of the bilateral knees dated 10/2019 ordered at Dr. Michail Sermon office in [place] demonstrate bone-on-bone arthritis in the medial and patellofemoral compartments with massive osteophyte formation throughout the right knee. There is tibial subluxation of bilateral knees. There is no active infection.  There are no problems to display for this patient.   Past Medical History:  Diagnosis Date  . Anemia   . Arthritis   . Hypertension   . Hypothyroidism   . PONV (postoperative nausea and vomiting)     Past Surgical History:  Procedure Laterality Date  . CHOLECYSTECTOMY    . FLEXOR TENOTOMY Left 12/28/2019   Procedure: FLEXOR TENOTOMY;  Surgeon: Tyson Babinski, DPM;  Location: [facility];  Service: Podiatry;  Laterality: Left;  . REPAIR EXTENSOR TENDON Left 12/28/2019   Procedure: EXTENSOR TENDON LENGTHENING;  Surgeon: Tyson Babinski, DPM;  Location: [facility];  Service: Podiatry;  Laterality: Left;  . THYROIDECTOMY    . TOE ARTHROPLASTY Bilateral 12/28/2019   Procedure: TOE ARTHROPLASTY RIGHT SECOND TOE AND LEFT SECOND TOE;  Surgeon: Tyson Babinski, DPM;  Location: [facility];  Service: Podiatry;  Laterality: Bilateral;    Prior to Admission medications    Medication Sig Start Date End Date Taking? Authorizing Provider  calcium carbonate (OSCAL) 1500 (600 Ca) MG TABS tablet Take 600 mg of elemental calcium by mouth 2 (two) times daily with a meal.    [provider]  levothyroxine (SYNTHROID) 100 MCG tablet Take 100 mcg by mouth daily before breakfast.    [provider]  magnesium oxide (MAG-OX) 400 MG tablet Take 800 mg by mouth 2 (two) times daily.    [provider]  meloxicam (MOBIC) 15 MG tablet Take 15 mg by mouth daily.    [provider]  valsartan-hydrochlorothiazide (DIOVAN-HCT) 160-25 MG tablet Take 1 tablet by mouth daily. 11/07/19   [provider]    Allergies  Allergen Reactions  . Codeine Nausea And Vomiting    Social History   Socioeconomic History  . Marital status: Unknown    Spouse name: Not on file  . Number of children: Not on file  . Years of education: Not on file  . Highest education level: Not on file  Occupational History  . Not on file  Tobacco Use  . Smoking status: Never Smoker  . Smokeless tobacco: Never Used  Vaping Use  . Vaping Use: Never used  Substance and Sexual Activity  . Alcohol use: Yes    Comment: occassional  . Drug use: Never  . Sexual activity: Yes  Other Topics Concern  . Not on file  Social History Narrative  . Not on file   Social Determinants of Health   Financial Resource Strain: Not on file  Food Insecurity: Not on file  Transportation Needs: Not on file  Physical Activity: Not on file  Stress: Not on file  Social Connections: Not on file  Intimate Partner Violence: Not on file    Tobacco Use: Low Risk   . Smoking Tobacco Use: Never Smoker  . Smokeless Tobacco Use: Never Used   Social History   Substance and Sexual Activity  Alcohol Use Yes   Comment: occassional    No family history on file.  ROS:  Constitutional: no fever, no significant weight gain, no significant weight loss  Eyes: no dry eyes, no  irritation, no vision change, no sore throat  Cardiovascular: no chest pain, no palpitations, leg swelling  Respiratory: no cough, no shortness of breath, No COPD  Gastrointestinal: no vomiting, no diarrhea, not vomiting blood  Genitourinary: no blood in urine, no difficulty urinating  Musculoskeletal: Joint Pain, swelling in Joints  Skin: no rashes, no varicose veins  Neurologic: no numbness, no seizures, no dizziness, no difficulty with balance  Endocrine: temperature intolerance (normal) to heat  Hematologic/Lymphatic no swollen glands, no bruising  Objective:  Physical Exam: Well nourished and well developed.  General: Alert and oriented x3, cooperative and pleasant, no acute distress.  Head: normocephalic, atraumatic, neck supple.  Eyes: EOMI.  Respiratory: breath sounds clear in all fields, no wheezing, rales, or rhonchi. Cardiovascular: Regular rate and rhythm, no murmurs, gallops or rubs.  Abdomen: non-tender to palpation and soft, normoactive bowel sounds. Musculoskeletal:  Bilateral Hip Exam:  The range of motion: normal without discomfort.    Right Knee Exam:  No effusion present. No swelling present.  The range of motion is: 10 to 110 degrees.  Marked crepitus on range of motion of the knee.  Medial greater than lateral joint line tenderness.  The knee is stable.    Left Knee Exam:  No effusion present. No swelling present.  The Range of motion is: 10 to 120 degrees.  Marked crepitus on range of motion of the knee.  Positive medial greater than lateral joint line tenderness.  The knee is stable.    The patient's sensation and motor function are intact in their lower extremities. Their distal pulses are 2+. The bilateral calves are soft and non-tender.  Vital signs in last 24 hours: BP: ()/()  Arterial Line BP: ()/()   Imaging Review AP and lateral of the bilateral knees dated 10/2019 from Dr. Michail Sermon office in [place] demonstrate bone-on-bone  arthritis in the medial and patellofemoral compartments with massive osteophyte formation throughout the right knee. There is tibial subluxation of bilateral knees.  Assessment/Plan:  End stage arthritis, right knee   The patient history, physical examination, clinical judgment of the provider and imaging studies are consistent with end stage degenerative joint disease of the right knee and total knee arthroplasty is deemed medically necessary. The treatment options including medical management, injection therapy arthroscopy and arthroplasty were discussed at length. The risks and benefits of total knee arthroplasty were presented and reviewed. The risks due to aseptic loosening, infection, stiffness, patella tracking problems, thromboembolic complications and other imponderables were discussed. The patient acknowledged the explanation, agreed to proceed with the plan and consent was signed. Patient is being admitted for inpatient treatment for surgery, pain control, PT, OT, prophylactic antibiotics, VTE prophylaxis, progressive ambulation and ADLs and discharge planning. The patient is planning to be discharged home.   Patient's anticipated LOS is less than 2 midnights, meeting these requirements: - Younger than 44 - Lives within 1 hour of care - Has a competent adult at home to recover with  post-op recover - NO history of  - Chronic pain requiring opiods  - Diabetes  - Coronary Artery Disease  - Heart failure  - Heart attack  - Stroke  - DVT/VTE  - Cardiac arrhythmia  - Respiratory Failure/COPD  - Renal failure  - Anemia  - Advanced Liver disease  Therapy Plans: Home Therapy (x2 weeks) followed by Outpatient Therapy ([place], [place]) Disposition: Shanon Ace (friend)  Planned DVT Prophylaxis: Xarelto 10mg  (Hx of thyroid cancer) DME Needed: Gilford Rile, 3-in-1 PCP: Dr. Ralph Leyden (Clearance received) TXA: IV Allergies: Codeine (N/V) Anesthesia Concerns:  N/V BMI: 34.9 Last HgbA1c: No  Pharmacy: [facility])   - Patient was instructed on what medications to stop prior to surgery. - Follow-up visit in 2 weeks with Dr. Wynelle Link - Begin physical therapy following surgery - Pre-operative lab work as pre-surgical testing - Prescriptions will be provided in hospital at time of discharge  Fenton Foy, [facility], PA-C Orthopedic Surgery [facility] [place]

## 2020-08-01 NOTE — Progress Notes (Addendum)
PCP - Prodeep Pradhan,MD clearance on chart  Cardiologist - no  PPM/ICD -  Device Orders -  Rep Notified -   Chest x-ray -  EKG - 04-02-20 on chart labs 04-02-20 on chart Stress Test -  ECHO -  Cardiac Cath -   Sleep Study -  CPAP -   Fasting Blood Sugar -  Checks Blood Sugar _____ times a day  Blood Thinner Instructions: Aspirin Instructions:  [facility] Protcol - PRE-SURGERY  G2-   COVID TEST- 08-02-20  Activity- can walk a flight of stairs without sob  Anesthesia review: HTN  Patient denies shortness of breath, fever, cough and chest pain at PAT appointment   none   All instructions explained to the patient, with a verbal understanding of the material. Patient agrees to go over the instructions while at home for a better understanding. Patient also instructed to self quarantine after being tested for COVID-19. The opportunity to ask questions was provided.

## 2020-08-01 NOTE — Patient Instructions (Signed)
DUE TO COVID-19 ONLY ONE VISITOR IS ALLOWED TO COME WITH YOU AND STAY IN THE WAITING ROOM ONLY DURING PRE OP AND PROCEDURE DAY OF SURGERY. THE 1 VISITOR  MAY VISIT WITH YOU AFTER SURGERY IN YOUR PRIVATE ROOM DURING VISITING HOURS ONLY!  YOU NEED TO HAVE A COVID 19 TEST ON__1-20-22_____ @_______ , THIS TEST MUST BE DONE BEFORE SURGERY,  COVID TESTING SITE [address] [place] [address], IT IS ON THE RIGHT GOING OUT [address] APPROXIMATELY  2 MINUTES PAST [employer] ON THE RIGHT. ONCE YOUR COVID TEST IS COMPLETED,  PLEASE BEGIN THE QUARANTINE INSTRUCTIONS AS OUTLINED IN YOUR HANDOUT.                Jordan [name]  08/01/2020   Your procedure is scheduled on: 08-06-20   Report to [facility] Main  Entrance   Report to admitting at     0550 AM     Call this number if you have problems the morning of surgery [phone]    Remember: NO SOLID FOOD AFTER MIDNIGHT THE NIGHT PRIOR TO SURGERY. NOTHING BY MOUTH EXCEPT CLEAR LIQUIDS UNTIL  0520 am . PLEASE FINISH ENSURE DRINK PER SURGEON ORDER  WHICH NEEDS TO BE COMPLETED AT   0520 am then nothing by mouth .     CLEAR LIQUID DIET   Foods Allowed                                                                     Foods Excluded  Coffee and tea, regular and decaf                             liquids that you cannot  Plain Jell-O any favor except red or purple                                           see through such as: Fruit ices (not with fruit pulp)                                     milk, soups, orange juice  Iced Popsicles                                    All solid food Carbonated beverages, regular and diet                                    Cranberry, grape and apple juices Sports drinks like Gatorade Lightly seasoned clear broth or consume(fat free) Sugar, honey syrup  Sample Menu Breakfast                                Lunch  Supper Cranberry juice                     Beef broth                            Chicken broth Jell-O                                     Grape juice                           Apple juice Coffee or tea                        Jell-O                                      Popsicle                                                Coffee or tea                        Coffee or tea  _____________________________________________________________________     CLEAR LIQUID DIET   Foods Allowed                                                                      Black Coffee and tea, regular and decaf                           Plain Jell-O any favor except red or purple                                           Fruit ices (not with fruit pulp)                                      Iced Popsicles                                   Carbonated beverages, regular and diet                                    Cranberry, grape and apple juices Sports drinks like Gatorade Lightly seasoned clear broth or consume(fat free) Sugar, honey syrup   _____________________________________________________________________    BRUSH YOUR TEETH MORNING OF SURGERY AND RINSE YOUR MOUTH OUT, NO CHEWING GUM CANDY OR MINTS.     Take these medicines the morning of surgery with  A SIP OF WATER: levothyroxine                                 You may not have any metal on your body including hair pins and              piercings  Do not wear jewelry, make-up, lotions, powders or perfumes, deodorant             Do not wear nail polish on your fingernails.  Do not shave  48 hours prior to surgery.     Do not bring valuables to the hospital. [place].  Contacts, dentures or bridgework may not be worn into surgery.       Patients discharged the day of surgery will not be allowed to drive home. IF YOU ARE HAVING SURGERY AND GOING HOME THE SAME DAY, YOU MUST HAVE AN ADULT TO DRIVE YOU HOME AND BE WITH YOU FOR 24  HOURS. YOU MAY GO HOME BY TAXI OR UBER OR ORTHERWISE, BUT AN ADULT MUST ACCOMPANY YOU HOME AND STAY WITH YOU FOR 24 HOURS.  Name and phone number of your driver:  Special Instructions: N/A              Please read over the following fact sheets you were given: _____________________________________________________________________             [facility] - Preparing for Surgery Before surgery, you can play an important role.  Because skin is not sterile, your skin needs to be as free of germs as possible.  You can reduce the number of germs on your skin by washing with CHG (chlorahexidine gluconate) soap before surgery.  CHG is an antiseptic cleaner which kills germs and bonds with the skin to continue killing germs even after washing. Please DO NOT use if you have an allergy to CHG or antibacterial soaps.  If your skin becomes reddened/irritated stop using the CHG and inform your nurse when you arrive at Short Stay. Do not shave (including legs and underarms) for at least 48 hours prior to the first CHG shower.  You may shave your face/neck. Please follow these instructions carefully:  1.  Shower with CHG Soap the night before surgery and the  morning of Surgery.  2.  If you choose to wash your hair, wash your hair first as usual with your  normal  shampoo.  3.  After you shampoo, rinse your hair and body thoroughly to remove the  shampoo.                           4.  Use CHG as you would any other liquid soap.  You can apply chg directly  to the skin and wash                       Gently with a scrungie or clean washcloth.  5.  Apply the CHG Soap to your body ONLY FROM THE NECK DOWN.   Do not use on face/ open                           Wound or open sores. Avoid contact with eyes, ears mouth and genitals (private parts).  Wash face,  Genitals (private parts) with your normal soap.             6.  Wash thoroughly, paying special attention to the area where your surgery   will be performed.  7.  Thoroughly rinse your body with warm water from the neck down.  8.  DO NOT shower/wash with your normal soap after using and rinsing off  the CHG Soap.                9.  Pat yourself dry with a clean towel.            10.  Wear clean pajamas.            11.  Place clean sheets on your bed the night of your first shower and do not  sleep with pets. Day of Surgery : Do not apply any lotions/deodorants the morning of surgery.  Please wear clean clothes to the hospital/surgery center.  FAILURE TO FOLLOW THESE INSTRUCTIONS MAY RESULT IN THE CANCELLATION OF YOUR SURGERY PATIENT SIGNATURE_________________________________  NURSE SIGNATURE__________________________________  ________________________________________________________________________   Jordan [name]  An incentive spirometer is a tool that can help keep your lungs clear and active. This tool measures how well you are filling your lungs with each breath. Taking long deep breaths may help reverse or decrease the chance of developing breathing (pulmonary) problems (especially infection) following:  A long period of time when you are unable to move or be active. BEFORE THE PROCEDURE   If the spirometer includes an indicator to show your best effort, your nurse or respiratory therapist will set it to a desired goal.  If possible, sit up straight or lean slightly forward. Try not to slouch.  Hold the incentive spirometer in an upright position. INSTRUCTIONS FOR USE  1. Sit on the edge of your bed if possible, or sit up as far as you can in bed or on a chair. 2. Hold the incentive spirometer in an upright position. 3. Breathe out normally. 4. Place the mouthpiece in your mouth and seal your lips tightly around it. 5. Breathe in slowly and as deeply as possible, raising the piston or the ball toward the top of the column. 6. Hold your breath for 3-5 seconds or for as long as possible. Allow the piston or  ball to fall to the bottom of the column. 7. Remove the mouthpiece from your mouth and breathe out normally. 8. Rest for a few seconds and repeat Steps 1 through 7 at least 10 times every 1-2 hours when you are awake. Take your time and take a few normal breaths between deep breaths. 9. The spirometer may include an indicator to show your best effort. Use the indicator as a goal to work toward during each repetition. 10. After each set of 10 deep breaths, practice coughing to be sure your lungs are clear. If you have an incision (the cut made at the time of surgery), support your incision when coughing by placing a pillow or rolled up towels firmly against it. Once you are able to get out of bed, walk around indoors and cough well. You may stop using the incentive spirometer when instructed by your caregiver.  RISKS AND COMPLICATIONS  Take your time so you do not get dizzy or light-headed.  If you are in pain, you may need to take or ask for pain medication before doing incentive spirometry. It is harder to take a deep breath if you are having  pain. AFTER USE  Rest and breathe slowly and easily.  It can be helpful to keep track of a log of your progress. Your caregiver can provide you with a simple table to help with this. If you are using the spirometer at home, follow these instructions: [place] IF:   You are having difficultly using the spirometer.  You have trouble using the spirometer as often as instructed.  Your pain medication is not giving enough relief while using the spirometer.  You develop fever of 100.5 F (38.1 C) or higher. SEEK IMMEDIATE MEDICAL CARE IF:   You cough up bloody sputum that had not been present before.  You develop fever of 102 F (38.9 C) or greater.  You develop worsening pain at or near the incision site. MAKE SURE YOU:   Understand these instructions.  Will watch your condition.  Will get help right away if you are not doing well  or get worse. Document Released: 11/10/2006 Document Revised: 09/22/2011 Document Reviewed: 01/11/2007 [facility] Patient Information 2014 [place], [place].   ________________________________________________________________________

## 2020-08-02 ENCOUNTER — Other Ambulatory Visit: Payer: Self-pay

## 2020-08-02 ENCOUNTER — Other Ambulatory Visit (HOSPITAL_COMMUNITY)
Admission: RE | Admit: 2020-08-02 | Discharge: 2020-08-02 | Disposition: A | Discharge status: Home / Self Care | Payer: Medicare Other | Source: Ambulatory Visit | Attending: Orthopedic Surgery | Admitting: Orthopedic Surgery

## 2020-08-02 ENCOUNTER — Encounter (HOSPITAL_COMMUNITY): Payer: Self-pay

## 2020-08-02 ENCOUNTER — Encounter (HOSPITAL_COMMUNITY)
Admission: RE | Admit: 2020-08-02 | Discharge: 2020-08-02 | Disposition: A | Discharge status: Home / Self Care | Payer: Medicare Other | Source: Ambulatory Visit | Attending: Orthopedic Surgery | Admitting: Orthopedic Surgery

## 2020-08-02 DIAGNOSIS — Z01818 Encounter for other preprocedural examination: Secondary | ICD-10-CM | POA: Insufficient documentation

## 2020-08-02 DIAGNOSIS — U071 COVID-19: Secondary | ICD-10-CM | POA: Insufficient documentation

## 2020-08-02 HISTORY — DX: Prediabetes: R73.03

## 2020-08-02 HISTORY — DX: Malignant (primary) neoplasm, unspecified: C80.1

## 2020-08-02 LAB — COMPREHENSIVE METABOLIC PANEL
ALT: 17 U/L (ref 0–44)
AST: 20 U/L (ref 15–41)
Albumin: 4 g/dL (ref 3.5–5.0)
Alkaline Phosphatase: 64 U/L (ref 38–126)
Anion gap: 10 (ref 5–15)
BUN: 23 mg/dL (ref 8–23)
CO2: 27 mmol/L (ref 22–32)
Calcium: 8.8 mg/dL — ABNORMAL LOW (ref 8.9–10.3)
Chloride: 99 mmol/L (ref 98–111)
Creatinine, Ser: 1.07 mg/dL — ABNORMAL HIGH (ref 0.44–1.00)
GFR, Estimated: 57 mL/min — ABNORMAL LOW (ref >60)
Glucose, Bld: 93 mg/dL (ref 70–99)
Potassium: 3.9 mmol/L (ref 3.5–5.1)
Sodium: 136 mmol/L (ref 135–145)
Total Bilirubin: 0.4 mg/dL (ref 0.3–1.2)
Total Protein: 6.9 g/dL (ref 6.5–8.1)

## 2020-08-02 LAB — CBC
HCT: 38.7 % (ref 36.0–46.0)
Hemoglobin: 12.4 g/dL (ref 12.0–15.0)
MCH: 28 pg (ref 26.0–34.0)
MCHC: 32 g/dL (ref 30.0–36.0)
MCV: 87.4 fL (ref 80.0–100.0)
Platelets: 149 10*3/uL — ABNORMAL LOW (ref 150–400)
RBC: 4.43 MIL/uL (ref 3.87–5.11)
RDW: 13.1 % (ref 11.5–15.5)
WBC: 6 10*3/uL (ref 4.0–10.5)
nRBC: 0 % (ref 0.0–0.2)

## 2020-08-02 LAB — APTT: aPTT: 27 seconds (ref 24–36)

## 2020-08-02 LAB — SURGICAL PCR SCREEN
MRSA, PCR: NEGATIVE
Staphylococcus aureus: NEGATIVE

## 2020-08-02 LAB — PROTIME-INR
INR: 1 (ref 0.8–1.2)
Prothrombin Time: 13 seconds (ref 11.4–15.2)

## 2020-08-02 LAB — HEMOGLOBIN A1C
Hgb A1c MFr Bld: 5.3 % (ref 4.8–5.6)
Mean Plasma Glucose: 105.41 mg/dL

## 2020-08-02 LAB — SARS CORONAVIRUS 2 (TAT 6-24 HRS): SARS Coronavirus 2: POSITIVE — AB

## 2020-08-03 NOTE — Progress Notes (Signed)
Patient tested positive for COVID on 08/02/20, message left for surgery scheduler for Dr. Wynelle Link

## 2020-08-06 ENCOUNTER — Ambulatory Visit (HOSPITAL_COMMUNITY): Admission: RE | Admit: 2020-08-06 | Payer: Medicare Other | Source: Home / Self Care | Admitting: Orthopedic Surgery

## 2020-08-06 ENCOUNTER — Encounter (HOSPITAL_COMMUNITY): Admission: RE | Payer: Self-pay | Source: Home / Self Care

## 2020-08-06 LAB — TYPE AND SCREEN
ABO/RH(D): A POS
Antibody Screen: NEGATIVE

## 2020-08-06 SURGERY — ARTHROPLASTY, KNEE, TOTAL
Anesthesia: Choice | Site: Knee | Laterality: Right

## 2020-09-24 NOTE — Progress Notes (Addendum)
   PCP - Prodeep Pradhan,MD clearance on chart  Cardiologist - no  PPM/ICD -  Device Orders -  Rep Notified -   Chest x-ray -  EKG - 04-02-20 on chart labs 04-02-20 on chart Stress Test -  ECHO -  Cardiac Cath -   Sleep Study -  CPAP -   Fasting Blood Sugar -  Checks Blood Sugar _____ times a day  Blood Thinner Instructions: Aspirin Instructions:  ERAS Protcol - PRE-SURGERY  G2-   COVID TEST-    POSITIVE 08-02-20    Activity- can walk a flight of stairs without sob  Anesthesia review: HTN  Patient denies shortness of breath, fever, cough and chest pain at PAT appointment   none   All instructions explained to the patient, with a verbal understanding of the material. Patient agrees to go over the instructions while at home for a better understanding. Patient also instructed to self quarantine after being tested for COVID-19. The opportunity to ask questions was provided.

## 2020-09-24 NOTE — Patient Instructions (Addendum)
DUE TO COVID-19 ONLY ONE VISITOR IS ALLOWED TO COME WITH YOU AND STAY IN THE WAITING ROOM ONLY DURING PRE OP AND PROCEDURE DAY OF SURGERY.  TWO VISITOR  MAY VISIT WITH YOU AFTER SURGERY IN YOUR PRIVATE ROOM DURING VISITING HOURS ONLY!              Jordan [name]  09/24/2020   Your procedure is scheduled on: 10-08-20   Report to [facility] Main  Entrance   Report to admitting at     0550  AM     Call this number if you have problems the morning of surgery [phone]    Remember: NO SOLID FOOD AFTER MIDNIGHT THE NIGHT PRIOR TO SURGERY. NOTHING BY MOUTH EXCEPT CLEAR LIQUIDS UNTIL                                     0520 am .   PLEASE FINISH ENSURE DRINK PER SURGEON ORDER  WHICH NEEDS TO BE COMPLETED AT   0520 am then nothing by mouth .     CLEAR LIQUID DIET   Foods Allowed                                                                     Foods Excluded  Black Coffee and tea, regular and decaf                             liquids that you cannot  Plain Jell-O any favor except red or purple                                           see through such as: Fruit ices (not with fruit pulp)                                                  milk, soups, orange juice  Iced Popsicles                                            All solid food Carbonated beverages, regular and diet                                    Cranberry, grape and apple juices Sports drinks like Gatorade Lightly seasoned clear broth or consume(fat free) Sugar, honey syrup   _____________________________________________________________________   _____________________________________________________________________    BRUSH YOUR TEETH MORNING OF SURGERY AND RINSE YOUR MOUTH OUT, NO CHEWING GUM CANDY OR MINTS.     Take these medicines the morning of surgery with A SIP OF WATER: levothyroxine  You may not have any metal on your body including hair pins and               piercings  Do not wear jewelry, make-up, lotions, powders or perfumes, deodorant             Do not wear nail polish on your fingernails.  Do not shave  48 hours prior to surgery.     Do not bring valuables to the hospital. [place].  Contacts, dentures or bridgework may not be worn into surgery.       Patients discharged the day of surgery will not be allowed to drive home. IF YOU ARE HAVING SURGERY AND GOING HOME THE SAME DAY, YOU MUST HAVE AN ADULT TO DRIVE YOU HOME AND BE WITH YOU FOR 24 HOURS. YOU MAY GO HOME BY TAXI OR UBER OR ORTHERWISE, BUT AN ADULT MUST ACCOMPANY YOU HOME AND STAY WITH YOU FOR 24 HOURS.  Name and phone number of your driver:  Special Instructions: N/A              Please read over the following fact sheets you were given: _____________________________________________________________________             [facility] - Preparing for Surgery Before surgery, you can play an important role.  Because skin is not sterile, your skin needs to be as free of germs as possible.  You can reduce the number of germs on your skin by washing with CHG (chlorahexidine gluconate) soap before surgery.  CHG is an antiseptic cleaner which kills germs and bonds with the skin to continue killing germs even after washing. Please DO NOT use if you have an allergy to CHG or antibacterial soaps.  If your skin becomes reddened/irritated stop using the CHG and inform your nurse when you arrive at Short Stay. Do not shave (including legs and underarms) for at least 48 hours prior to the first CHG shower.  You may shave your face/neck. Please follow these instructions carefully:  1.  Shower with CHG Soap the night before surgery and the  morning of Surgery.  2.  If you choose to wash your hair, wash your hair first as usual with your  normal  shampoo.  3.  After you shampoo, rinse your hair and body thoroughly to remove the  shampoo.                            4.  Use CHG as you would any other liquid soap.  You can apply chg directly  to the skin and wash                       Gently with a scrungie or clean washcloth.  5.  Apply the CHG Soap to your body ONLY FROM THE NECK DOWN.   Do not use on face/ open                           Wound or open sores. Avoid contact with eyes, ears mouth and genitals (private parts).                       Wash face,  Genitals (private parts) with your normal soap.  6.  Wash thoroughly, paying special attention to the area where your surgery  will be performed.  7.  Thoroughly rinse your body with warm water from the neck down.  8.  DO NOT shower/wash with your normal soap after using and rinsing off  the CHG Soap.                9.  Pat yourself dry with a clean towel.            10.  Wear clean pajamas.            11.  Place clean sheets on your bed the night of your first shower and do not  sleep with pets. Day of Surgery : Do not apply any lotions/deodorants the morning of surgery.  Please wear clean clothes to the hospital/surgery center.  FAILURE TO FOLLOW THESE INSTRUCTIONS MAY RESULT IN THE CANCELLATION OF YOUR SURGERY PATIENT SIGNATURE_________________________________  NURSE SIGNATURE__________________________________  ________________________________________________________________________   Adam Phenix  An incentive spirometer is a tool that can help keep your lungs clear and active. This tool measures how well you are filling your lungs with each breath. Taking long deep breaths may help reverse or decrease the chance of developing breathing (pulmonary) problems (especially infection) following:  A long period of time when you are unable to move or be active. BEFORE THE PROCEDURE   If the spirometer includes an indicator to show your best effort, your nurse or respiratory therapist will set it to a desired goal.  If possible, sit up straight or lean slightly  forward. Try not to slouch.  Hold the incentive spirometer in an upright position. INSTRUCTIONS FOR USE  1. Sit on the edge of your bed if possible, or sit up as far as you can in bed or on a chair. 2. Hold the incentive spirometer in an upright position. 3. Breathe out normally. 4. Place the mouthpiece in your mouth and seal your lips tightly around it. 5. Breathe in slowly and as deeply as possible, raising the piston or the ball toward the top of the column. 6. Hold your breath for 3-5 seconds or for as long as possible. Allow the piston or ball to fall to the bottom of the column. 7. Remove the mouthpiece from your mouth and breathe out normally. 8. Rest for a few seconds and repeat Steps 1 through 7 at least 10 times every 1-2 hours when you are awake. Take your time and take a few normal breaths between deep breaths. 9. The spirometer may include an indicator to show your best effort. Use the indicator as a goal to work toward during each repetition. 10. After each set of 10 deep breaths, practice coughing to be sure your lungs are clear. If you have an incision (the cut made at the time of surgery), support your incision when coughing by placing a pillow or rolled up towels firmly against it. Once you are able to get out of bed, walk around indoors and cough well. You may stop using the incentive spirometer when instructed by your caregiver.  RISKS AND COMPLICATIONS  Take your time so you do not get dizzy or light-headed.  If you are in pain, you may need to take or ask for pain medication before doing incentive spirometry. It is harder to take a deep breath if you are having pain. AFTER USE  Rest and breathe slowly and easily.  It can be helpful to keep track of a log of your  progress. Your caregiver can provide you with a simple table to help with this. If you are using the spirometer at home, follow these instructions: [place] IF:   You are having difficultly using the  spirometer.  You have trouble using the spirometer as often as instructed.  Your pain medication is not giving enough relief while using the spirometer.  You develop fever of 100.5 F (38.1 C) or higher. SEEK IMMEDIATE MEDICAL CARE IF:   You cough up bloody sputum that had not been present before.  You develop fever of 102 F (38.9 C) or greater.  You develop worsening pain at or near the incision site. MAKE SURE YOU:   Understand these instructions.  Will watch your condition.  Will get help right away if you are not doing well or get worse. Document Released: 11/10/2006 Document Revised: 09/22/2011 Document Reviewed: 01/11/2007 [facility] Patient Information 2014 [place], [place].   ________________________________________________________________________

## 2020-09-27 ENCOUNTER — Other Ambulatory Visit: Payer: Self-pay

## 2020-09-27 ENCOUNTER — Encounter (HOSPITAL_COMMUNITY): Payer: Self-pay

## 2020-09-27 ENCOUNTER — Encounter (HOSPITAL_COMMUNITY)
Admission: RE | Admit: 2020-09-27 | Discharge: 2020-09-27 | Disposition: A | Discharge status: Home / Self Care | Payer: Medicare Other | Source: Ambulatory Visit | Attending: Orthopedic Surgery | Admitting: Orthopedic Surgery

## 2020-09-27 DIAGNOSIS — Z01812 Encounter for preprocedural laboratory examination: Secondary | ICD-10-CM | POA: Diagnosis present

## 2020-09-27 HISTORY — DX: COVID-19: U07.1

## 2020-09-27 LAB — CBC
HCT: 37.1 % (ref 36.0–46.0)
Hemoglobin: 11.9 g/dL — ABNORMAL LOW (ref 12.0–15.0)
MCH: 27.7 pg (ref 26.0–34.0)
MCHC: 32.1 g/dL (ref 30.0–36.0)
MCV: 86.5 fL (ref 80.0–100.0)
Platelets: 173 10*3/uL (ref 150–400)
RBC: 4.29 MIL/uL (ref 3.87–5.11)
RDW: 13.2 % (ref 11.5–15.5)
WBC: 5.9 10*3/uL (ref 4.0–10.5)
nRBC: 0 % (ref 0.0–0.2)

## 2020-09-27 LAB — COMPREHENSIVE METABOLIC PANEL
ALT: 16 U/L (ref 0–44)
AST: 20 U/L (ref 15–41)
Albumin: 3.9 g/dL (ref 3.5–5.0)
Alkaline Phosphatase: 60 U/L (ref 38–126)
Anion gap: 9 (ref 5–15)
BUN: 18 mg/dL (ref 8–23)
CO2: 26 mmol/L (ref 22–32)
Calcium: 9.2 mg/dL (ref 8.9–10.3)
Chloride: 102 mmol/L (ref 98–111)
Creatinine, Ser: 1 mg/dL (ref 0.44–1.00)
GFR, Estimated: >60 mL/min (ref >60)
Glucose, Bld: 99 mg/dL (ref 70–99)
Potassium: 4.4 mmol/L (ref 3.5–5.1)
Sodium: 137 mmol/L (ref 135–145)
Total Bilirubin: 0.6 mg/dL (ref 0.3–1.2)
Total Protein: 7 g/dL (ref 6.5–8.1)

## 2020-09-27 LAB — SURGICAL PCR SCREEN
MRSA, PCR: NEGATIVE
Staphylococcus aureus: NEGATIVE

## 2020-09-27 LAB — HEMOGLOBIN A1C
Hgb A1c MFr Bld: 5.3 % (ref 4.8–5.6)
Mean Plasma Glucose: 105.41 mg/dL

## 2020-09-27 LAB — TYPE AND SCREEN
ABO/RH(D): A POS
Antibody Screen: NEGATIVE

## 2020-09-27 LAB — PROTIME-INR
INR: 1 (ref 0.8–1.2)
Prothrombin Time: 13.2 seconds (ref 11.4–15.2)

## 2020-09-27 LAB — APTT: aPTT: 29 seconds (ref 24–36)

## 2020-10-06 ENCOUNTER — Encounter (HOSPITAL_COMMUNITY): Payer: Self-pay | Admitting: Orthopedic Surgery

## 2020-10-06 NOTE — Anesthesia Preprocedure Evaluation (Addendum)
Anesthesia Evaluation  Patient identified by MRN, date of birth, ID band Patient awake    Reviewed: Allergy & Precautions, NPO status , Patient's Chart, lab work & pertinent test results  History of Anesthesia Complications (+) PONV and history of anesthetic complications  Airway Mallampati: II  TM Distance: >3 FB Neck ROM: Full    Dental  (+) Teeth Intact, Caps, Dental Advisory Given   Pulmonary neg pulmonary ROS,  Covid-19 08/02/20, resolved, was mildly symptomatic   Pulmonary exam normal breath sounds clear to auscultation       Cardiovascular hypertension, Pt. on medications Normal cardiovascular exam Rhythm:Regular Rate:Normal     Neuro/Psych negative neurological ROS  negative psych ROS   GI/Hepatic negative GI ROS, Neg liver ROS,   Endo/Other  Hypothyroidism Obesity Hx/o thyroid Ca S/P thyroidectomy Pre diabetes  Renal/GU negative Renal ROS  negative genitourinary   Musculoskeletal  (+) Arthritis , Osteoarthritis,  OA Right knee   Abdominal (+) + obese,   Peds  Hematology  (+) anemia ,   Anesthesia Other Findings   Reproductive/Obstetrics                            Anesthesia Physical Anesthesia Plan  ASA: II  Anesthesia Plan: Spinal   Post-op Pain Management:  Regional for Post-op pain   Induction:   PONV Risk Score and Plan: 4 or greater and Treatment may vary due to age or medical condition, Propofol infusion, Dexamethasone, Ondansetron and Amisulpride  Airway Management Planned: Natural Airway and Simple Face Mask  Additional Equipment: None  Intra-op Plan:   Post-operative Plan: Extubation in OR  Informed Consent: I have reviewed the patients History and Physical, chart, labs and discussed the procedure including the risks, benefits and alternatives for the proposed anesthesia with the patient or authorized representative who has indicated his/her understanding  and acceptance.     Dental advisory given  Plan Discussed with: CRNA and Anesthesiologist  Anesthesia Plan Comments:        Anesthesia Quick Evaluation

## 2020-10-08 ENCOUNTER — Inpatient Hospital Stay (HOSPITAL_COMMUNITY)
Admission: RE | Admit: 2020-10-08 | Discharge: 2020-10-14 | DRG: 470 | Disposition: A | Discharge status: Home Health | Payer: Medicare Other | Attending: Orthopedic Surgery | Admitting: Orthopedic Surgery

## 2020-10-08 ENCOUNTER — Ambulatory Visit (HOSPITAL_COMMUNITY): Payer: Medicare Other | Admitting: Anesthesiology

## 2020-10-08 ENCOUNTER — Encounter (HOSPITAL_COMMUNITY): Payer: Self-pay | Admitting: Orthopedic Surgery

## 2020-10-08 ENCOUNTER — Other Ambulatory Visit: Payer: Self-pay

## 2020-10-08 ENCOUNTER — Encounter (HOSPITAL_COMMUNITY)
Admission: RE | Disposition: A | Discharge status: Home Health | Payer: Self-pay | Source: Home / Self Care | Attending: Orthopedic Surgery

## 2020-10-08 DIAGNOSIS — Z79899 Other long term (current) drug therapy: Secondary | ICD-10-CM

## 2020-10-08 DIAGNOSIS — M25761 Osteophyte, right knee: Secondary | ICD-10-CM | POA: Diagnosis present

## 2020-10-08 DIAGNOSIS — Z791 Long term (current) use of non-steroidal anti-inflammatories (NSAID): Secondary | ICD-10-CM

## 2020-10-08 DIAGNOSIS — M1711 Unilateral primary osteoarthritis, right knee: Principal | ICD-10-CM | POA: Diagnosis present

## 2020-10-08 DIAGNOSIS — Z85850 Personal history of malignant neoplasm of thyroid: Secondary | ICD-10-CM

## 2020-10-08 DIAGNOSIS — E669 Obesity, unspecified: Secondary | ICD-10-CM | POA: Diagnosis present

## 2020-10-08 DIAGNOSIS — M1710 Unilateral primary osteoarthritis, unspecified knee: Secondary | ICD-10-CM | POA: Diagnosis present

## 2020-10-08 DIAGNOSIS — Z885 Allergy status to narcotic agent status: Secondary | ICD-10-CM

## 2020-10-08 DIAGNOSIS — Z9049 Acquired absence of other specified parts of digestive tract: Secondary | ICD-10-CM

## 2020-10-08 DIAGNOSIS — I951 Orthostatic hypotension: Secondary | ICD-10-CM | POA: Diagnosis not present

## 2020-10-08 DIAGNOSIS — Z79890 Hormone replacement therapy (postmenopausal): Secondary | ICD-10-CM

## 2020-10-08 DIAGNOSIS — Z8616 Personal history of COVID-19: Secondary | ICD-10-CM

## 2020-10-08 DIAGNOSIS — Z6834 Body mass index (BMI) 34.0-34.9, adult: Secondary | ICD-10-CM

## 2020-10-08 DIAGNOSIS — E1165 Type 2 diabetes mellitus with hyperglycemia: Secondary | ICD-10-CM | POA: Diagnosis present

## 2020-10-08 DIAGNOSIS — E039 Hypothyroidism, unspecified: Secondary | ICD-10-CM | POA: Diagnosis present

## 2020-10-08 DIAGNOSIS — M179 Osteoarthritis of knee, unspecified: Secondary | ICD-10-CM | POA: Diagnosis present

## 2020-10-08 DIAGNOSIS — I10 Essential (primary) hypertension: Secondary | ICD-10-CM | POA: Diagnosis present

## 2020-10-08 HISTORY — PX: TOTAL KNEE ARTHROPLASTY: SHX125

## 2020-10-08 SURGERY — ARTHROPLASTY, KNEE, TOTAL
Anesthesia: Spinal | Site: Knee | Laterality: Right

## 2020-10-08 MED ORDER — ONDANSETRON HCL 4 MG/2ML IJ SOLN
INTRAMUSCULAR | Status: AC
  Filled 2020-10-08: qty 2

## 2020-10-08 MED ORDER — DOCUSATE SODIUM 100 MG PO CAPS
100.0000 mg | ORAL_CAPSULE | Freq: Two times a day (BID) | ORAL | Status: DC
  Administered 2020-10-08 – 2020-10-14 (×13): 100 mg via ORAL
  Filled 2020-10-08 (×13): qty 1

## 2020-10-08 MED ORDER — ORAL CARE MOUTH RINSE: 15.0000 mL | Freq: Once | OROMUCOSAL | Status: AC

## 2020-10-08 MED ORDER — BUPIVACAINE LIPOSOME 1.3 % IJ SUSP
INTRAMUSCULAR | Status: DC | PRN
  Administered 2020-10-08: 20 mL

## 2020-10-08 MED ORDER — ORAL CARE MOUTH RINSE: 15.0000 mL | Freq: Once | OROMUCOSAL | Status: DC

## 2020-10-08 MED ORDER — SODIUM CHLORIDE 0.9 % IV SOLN: INTRAVENOUS | Status: DC

## 2020-10-08 MED ORDER — STERILE WATER FOR IRRIGATION IR SOLN
Status: DC | PRN
  Administered 2020-10-08: 2000 mL

## 2020-10-08 MED ORDER — CHLORHEXIDINE GLUCONATE 0.12 % MT SOLN
15.0000 mL | Freq: Once | OROMUCOSAL | Status: AC
  Administered 2020-10-08: 15 mL via OROMUCOSAL

## 2020-10-08 MED ORDER — MIDAZOLAM HCL 2 MG/2ML IJ SOLN
INTRAMUSCULAR | Status: AC
  Administered 2020-10-08: 1 mg via INTRAVENOUS
  Filled 2020-10-08: qty 2

## 2020-10-08 MED ORDER — LACTATED RINGERS IV SOLN: INTRAVENOUS | Status: DC

## 2020-10-08 MED ORDER — SCOPOLAMINE 1 MG/3DAYS TD PT72
MEDICATED_PATCH | TRANSDERMAL | Status: AC
  Filled 2020-10-08: qty 1

## 2020-10-08 MED ORDER — HYDROMORPHONE HCL 2 MG PO TABS
2.0000 mg | ORAL_TABLET | ORAL | Status: DC | PRN
  Administered 2020-10-08 – 2020-10-10 (×7): 2 mg via ORAL
  Filled 2020-10-08 (×3): qty 1
  Filled 2020-10-08: qty 2
  Filled 2020-10-08 (×4): qty 1

## 2020-10-08 MED ORDER — FENTANYL CITRATE (PF) 100 MCG/2ML IJ SOLN
INTRAMUSCULAR | Status: AC
  Administered 2020-10-08: 50 ug via INTRAVENOUS
  Filled 2020-10-08: qty 2

## 2020-10-08 MED ORDER — FENTANYL CITRATE (PF) 100 MCG/2ML IJ SOLN
25.0000 ug | INTRAMUSCULAR | Status: DC | PRN
  Administered 2020-10-08 (×2): 50 ug via INTRAVENOUS

## 2020-10-08 MED ORDER — PHENYLEPHRINE HCL (PRESSORS) 10 MG/ML IV SOLN
INTRAVENOUS | Status: AC
  Filled 2020-10-08: qty 1

## 2020-10-08 MED ORDER — BUPIVACAINE IN DEXTROSE 0.75-8.25 % IT SOLN
INTRATHECAL | Status: DC | PRN
  Administered 2020-10-08: 1.8 mL via INTRATHECAL

## 2020-10-08 MED ORDER — DIPHENHYDRAMINE HCL 12.5 MG/5ML PO ELIX
12.5000 mg | ORAL_SOLUTION | ORAL | Status: DC | PRN
  Administered 2020-10-11: 25 mg via ORAL
  Filled 2020-10-08: qty 10

## 2020-10-08 MED ORDER — VALSARTAN-HYDROCHLOROTHIAZIDE 160-25 MG PO TABS: 1.0000 | ORAL_TABLET | Freq: Every day | ORAL | Status: DC

## 2020-10-08 MED ORDER — PROPOFOL 10 MG/ML IV BOLUS
INTRAVENOUS | Status: DC | PRN
  Administered 2020-10-08 (×2): 20 mg via INTRAVENOUS

## 2020-10-08 MED ORDER — ONDANSETRON HCL 4 MG/2ML IJ SOLN
INTRAMUSCULAR | Status: DC | PRN
  Administered 2020-10-08: 4 mg via INTRAVENOUS

## 2020-10-08 MED ORDER — METHOCARBAMOL 1000 MG/10ML IJ SOLN
500.0000 mg | Freq: Four times a day (QID) | INTRAVENOUS | Status: DC | PRN
  Filled 2020-10-08: qty 5

## 2020-10-08 MED ORDER — MIDAZOLAM HCL 2 MG/2ML IJ SOLN
INTRAMUSCULAR | Status: AC
  Filled 2020-10-08: qty 2

## 2020-10-08 MED ORDER — MENTHOL 3 MG MT LOZG: 1.0000 | LOZENGE | OROMUCOSAL | Status: DC | PRN

## 2020-10-08 MED ORDER — ONDANSETRON HCL 4 MG/2ML IJ SOLN
4.0000 mg | Freq: Four times a day (QID) | INTRAMUSCULAR | Status: DC | PRN
  Administered 2020-10-08 – 2020-10-09 (×2): 4 mg via INTRAVENOUS
  Filled 2020-10-08 (×3): qty 2

## 2020-10-08 MED ORDER — FENTANYL CITRATE (PF) 100 MCG/2ML IJ SOLN: 50.0000 ug | INTRAMUSCULAR | Status: DC

## 2020-10-08 MED ORDER — FENTANYL CITRATE (PF) 100 MCG/2ML IJ SOLN
INTRAMUSCULAR | Status: DC | PRN
  Administered 2020-10-08: 50 ug via INTRAVENOUS

## 2020-10-08 MED ORDER — POLYETHYLENE GLYCOL 3350 17 G PO PACK
17.0000 g | PACK | Freq: Every day | ORAL | Status: DC | PRN
  Administered 2020-10-14: 17 g via ORAL
  Filled 2020-10-08: qty 1

## 2020-10-08 MED ORDER — SODIUM CHLORIDE 0.9 % IR SOLN
Status: DC | PRN
  Administered 2020-10-08: 1000 mL

## 2020-10-08 MED ORDER — PHENYLEPHRINE HCL-NACL 10-0.9 MG/250ML-% IV SOLN
INTRAVENOUS | Status: DC | PRN
  Administered 2020-10-08: 15 ug/min via INTRAVENOUS

## 2020-10-08 MED ORDER — SODIUM CHLORIDE (PF) 0.9 % IJ SOLN
INTRAMUSCULAR | Status: AC
  Filled 2020-10-08: qty 10

## 2020-10-08 MED ORDER — PROMETHAZINE HCL 25 MG/ML IJ SOLN
6.2500 mg | Freq: Once | INTRAMUSCULAR | Status: AC
  Administered 2020-10-08: 6.25 mg via INTRAVENOUS

## 2020-10-08 MED ORDER — CHLORHEXIDINE GLUCONATE 0.12 % MT SOLN: 15.0000 mL | Freq: Once | OROMUCOSAL | Status: DC

## 2020-10-08 MED ORDER — CEFAZOLIN SODIUM-DEXTROSE 2-4 GM/100ML-% IV SOLN
2.0000 g | Freq: Once | INTRAVENOUS | Status: AC
  Administered 2020-10-08: 2 g via INTRAVENOUS

## 2020-10-08 MED ORDER — MIDAZOLAM HCL 2 MG/2ML IJ SOLN: 1.0000 mg | INTRAMUSCULAR | Status: DC

## 2020-10-08 MED ORDER — ACETAMINOPHEN 500 MG PO TABS
1000.0000 mg | ORAL_TABLET | Freq: Four times a day (QID) | ORAL | Status: AC
  Administered 2020-10-08 – 2020-10-09 (×3): 1000 mg via ORAL
  Filled 2020-10-08 (×3): qty 2

## 2020-10-08 MED ORDER — ROPIVACAINE HCL 7.5 MG/ML IJ SOLN
INTRAMUSCULAR | Status: DC | PRN
  Administered 2020-10-08: 20 mL via PERINEURAL

## 2020-10-08 MED ORDER — FENTANYL CITRATE (PF) 100 MCG/2ML IJ SOLN
INTRAMUSCULAR | Status: AC
  Filled 2020-10-08: qty 2

## 2020-10-08 MED ORDER — ACETAMINOPHEN 10 MG/ML IV SOLN
1000.0000 mg | Freq: Once | INTRAVENOUS | Status: AC
  Administered 2020-10-08: 1000 mg via INTRAVENOUS

## 2020-10-08 MED ORDER — ONDANSETRON HCL 4 MG PO TABS
4.0000 mg | ORAL_TABLET | Freq: Four times a day (QID) | ORAL | Status: DC | PRN
  Administered 2020-10-09 – 2020-10-14 (×7): 4 mg via ORAL
  Filled 2020-10-08 (×7): qty 1

## 2020-10-08 MED ORDER — PHENOL 1.4 % MT LIQD: 1.0000 | OROMUCOSAL | Status: DC | PRN

## 2020-10-08 MED ORDER — PROPOFOL 1000 MG/100ML IV EMUL
INTRAVENOUS | Status: AC
  Filled 2020-10-08: qty 100

## 2020-10-08 MED ORDER — FLEET ENEMA 7-19 GM/118ML RE ENEM: 1.0000 | ENEMA | Freq: Once | RECTAL | Status: DC | PRN

## 2020-10-08 MED ORDER — PROPOFOL 500 MG/50ML IV EMUL
INTRAVENOUS | Status: DC | PRN
  Administered 2020-10-08: 100 ug/kg/min via INTRAVENOUS

## 2020-10-08 MED ORDER — SODIUM CHLORIDE (PF) 0.9 % IJ SOLN
INTRAMUSCULAR | Status: DC | PRN
  Administered 2020-10-08: 60 mL

## 2020-10-08 MED ORDER — PROMETHAZINE HCL 25 MG/ML IJ SOLN
INTRAMUSCULAR | Status: AC
  Filled 2020-10-08: qty 1

## 2020-10-08 MED ORDER — TRAMADOL HCL 50 MG PO TABS
50.0000 mg | ORAL_TABLET | Freq: Four times a day (QID) | ORAL | Status: DC | PRN
Start: 2020-10-08 — End: 2020-10-14
  Administered 2020-10-10 – 2020-10-12 (×4): 100 mg via ORAL
  Administered 2020-10-13 – 2020-10-14 (×4): 50 mg via ORAL
  Filled 2020-10-08: qty 1
  Filled 2020-10-08: qty 2
  Filled 2020-10-08 (×3): qty 1
  Filled 2020-10-08 (×3): qty 2

## 2020-10-08 MED ORDER — 0.9 % SODIUM CHLORIDE (POUR BTL) OPTIME
TOPICAL | Status: DC | PRN
  Administered 2020-10-08: 1000 mL

## 2020-10-08 MED ORDER — ACETAMINOPHEN 10 MG/ML IV SOLN
INTRAVENOUS | Status: AC
  Filled 2020-10-08: qty 100

## 2020-10-08 MED ORDER — IRBESARTAN 150 MG PO TABS
150.0000 mg | ORAL_TABLET | Freq: Every day | ORAL | Status: DC
  Administered 2020-10-09 – 2020-10-11 (×3): 150 mg via ORAL
  Filled 2020-10-08 (×4): qty 1

## 2020-10-08 MED ORDER — METOCLOPRAMIDE HCL 5 MG/ML IJ SOLN
5.0000 mg | Freq: Three times a day (TID) | INTRAMUSCULAR | Status: DC | PRN
Start: 2020-10-08 — End: 2020-10-14
  Administered 2020-10-08 (×2): 10 mg via INTRAVENOUS
  Administered 2020-10-09: 5 mg via INTRAVENOUS
  Filled 2020-10-08 (×3): qty 2

## 2020-10-08 MED ORDER — HYDROCHLOROTHIAZIDE 25 MG PO TABS
25.0000 mg | ORAL_TABLET | Freq: Every day | ORAL | Status: DC
  Administered 2020-10-09 – 2020-10-11 (×3): 25 mg via ORAL
  Filled 2020-10-08 (×4): qty 1

## 2020-10-08 MED ORDER — CEFAZOLIN SODIUM-DEXTROSE 2-4 GM/100ML-% IV SOLN
INTRAVENOUS | Status: AC
  Filled 2020-10-08: qty 100

## 2020-10-08 MED ORDER — RIVAROXABAN 10 MG PO TABS
10.0000 mg | ORAL_TABLET | Freq: Every day | ORAL | Status: DC
  Administered 2020-10-09 – 2020-10-14 (×6): 10 mg via ORAL
  Filled 2020-10-08 (×6): qty 1

## 2020-10-08 MED ORDER — ONDANSETRON HCL 4 MG/2ML IJ SOLN
4.0000 mg | Freq: Once | INTRAMUSCULAR | Status: AC | PRN
  Administered 2020-10-08: 4 mg via INTRAVENOUS

## 2020-10-08 MED ORDER — TRANEXAMIC ACID-NACL 1000-0.7 MG/100ML-% IV SOLN
INTRAVENOUS | Status: AC
  Filled 2020-10-08: qty 100

## 2020-10-08 MED ORDER — DEXAMETHASONE SODIUM PHOSPHATE 10 MG/ML IJ SOLN
INTRAMUSCULAR | Status: AC
  Filled 2020-10-08: qty 1

## 2020-10-08 MED ORDER — GABAPENTIN 300 MG PO CAPS
300.0000 mg | ORAL_CAPSULE | Freq: Three times a day (TID) | ORAL | Status: DC
  Administered 2020-10-08 – 2020-10-11 (×9): 300 mg via ORAL
  Filled 2020-10-08 (×10): qty 1

## 2020-10-08 MED ORDER — MIDAZOLAM HCL 5 MG/5ML IJ SOLN
INTRAMUSCULAR | Status: DC | PRN
  Administered 2020-10-08: 2 mg via INTRAVENOUS

## 2020-10-08 MED ORDER — DEXAMETHASONE SODIUM PHOSPHATE 10 MG/ML IJ SOLN
INTRAMUSCULAR | Status: DC | PRN
  Administered 2020-10-08: 10 mg via INTRAVENOUS

## 2020-10-08 MED ORDER — METOCLOPRAMIDE HCL 5 MG PO TABS
5.0000 mg | ORAL_TABLET | Freq: Three times a day (TID) | ORAL | Status: DC | PRN
  Administered 2020-10-09 – 2020-10-10 (×2): 5 mg via ORAL
  Filled 2020-10-08 (×2): qty 1

## 2020-10-08 MED ORDER — CEFAZOLIN SODIUM-DEXTROSE 2-4 GM/100ML-% IV SOLN
2.0000 g | Freq: Four times a day (QID) | INTRAVENOUS | Status: AC
  Administered 2020-10-08 (×2): 2 g via INTRAVENOUS
  Filled 2020-10-08 (×2): qty 100

## 2020-10-08 MED ORDER — BISACODYL 10 MG RE SUPP
10.0000 mg | Freq: Every day | RECTAL | Status: DC | PRN
  Administered 2020-10-14: 10 mg via RECTAL
  Filled 2020-10-08: qty 1

## 2020-10-08 MED ORDER — LEVOTHYROXINE SODIUM 75 MCG PO TABS
150.0000 ug | ORAL_TABLET | Freq: Every day | ORAL | Status: DC
  Administered 2020-10-08 – 2020-10-14 (×7): 150 ug via ORAL
  Filled 2020-10-08 (×7): qty 2

## 2020-10-08 MED ORDER — PROPOFOL 10 MG/ML IV BOLUS
INTRAVENOUS | Status: AC
  Filled 2020-10-08: qty 20

## 2020-10-08 MED ORDER — TRANEXAMIC ACID-NACL 1000-0.7 MG/100ML-% IV SOLN
1000.0000 mg | INTRAVENOUS | Status: AC
  Administered 2020-10-08: 1000 mg via INTRAVENOUS

## 2020-10-08 MED ORDER — METHOCARBAMOL 500 MG PO TABS
500.0000 mg | ORAL_TABLET | Freq: Four times a day (QID) | ORAL | Status: DC | PRN
  Administered 2020-10-08 – 2020-10-14 (×6): 500 mg via ORAL
  Filled 2020-10-08 (×6): qty 1

## 2020-10-08 MED ORDER — DEXAMETHASONE SODIUM PHOSPHATE 10 MG/ML IJ SOLN
10.0000 mg | Freq: Once | INTRAMUSCULAR | Status: AC
  Administered 2020-10-09: 10 mg via INTRAVENOUS
  Filled 2020-10-08: qty 1

## 2020-10-08 MED ORDER — MORPHINE SULFATE (PF) 2 MG/ML IV SOLN
0.5000 mg | INTRAVENOUS | Status: DC | PRN
  Administered 2020-10-08: 1 mg via INTRAVENOUS
  Filled 2020-10-08: qty 1

## 2020-10-08 SURGICAL SUPPLY — 53 items
ATTUNE MED DOME PAT 38 KNEE (Knees) ×2 IMPLANT
ATTUNE PS FEM RT SZ 8 CEM KNEE (Femur) ×2 IMPLANT
ATTUNE PSRP INSR SZ8 8 KNEE (Insert) ×2 IMPLANT
BAG ZIPLOCK 12X15 (MISCELLANEOUS) ×2 IMPLANT
BASE TIBIAL ROT PLAT SZ 8 KNEE (Knees) ×1 IMPLANT
BLADE SAG 18X100X1.27 (BLADE) ×2 IMPLANT
BLADE SAW SGTL 11.0X1.19X90.0M (BLADE) ×2 IMPLANT
BNDG ELASTIC 6X5.8 VLCR STR LF (GAUZE/BANDAGES/DRESSINGS) ×2 IMPLANT
BOWL SMART MIX CTS (DISPOSABLE) ×2 IMPLANT
CEMENT HV SMART SET (Cement) ×4 IMPLANT
CLSR STERI-STRIP ANTIMIC 1/2X4 (GAUZE/BANDAGES/DRESSINGS) ×2 IMPLANT
COVER SURGICAL LIGHT HANDLE (MISCELLANEOUS) ×2 IMPLANT
COVER WAND RF STERILE (DRAPES) IMPLANT
CUFF TOURN SGL QUICK 34 (TOURNIQUET CUFF)
CUFF TOURN SGL QUICK 42 (TOURNIQUET CUFF) ×2 IMPLANT
CUFF TRNQT CYL 34X4.125X (TOURNIQUET CUFF) IMPLANT
DECANTER SPIKE VIAL GLASS SM (MISCELLANEOUS) ×2 IMPLANT
DRAPE U-SHAPE 47X51 STRL (DRAPES) ×2 IMPLANT
DRSG AQUACEL AG ADV 3.5X10 (GAUZE/BANDAGES/DRESSINGS) ×2 IMPLANT
DURAPREP 26ML APPLICATOR (WOUND CARE) ×2 IMPLANT
ELECT REM PT RETURN 15FT ADLT (MISCELLANEOUS) ×2 IMPLANT
GLOVE SRG 8 PF TXTR STRL LF DI (GLOVE) ×1 IMPLANT
GLOVE SURG ENC MOIS LTX SZ6.5 (GLOVE) ×2 IMPLANT
GLOVE SURG ENC MOIS LTX SZ8 (GLOVE) ×4 IMPLANT
GLOVE SURG UNDER POLY LF SZ6.5 (GLOVE) ×2 IMPLANT
GLOVE SURG UNDER POLY LF SZ8 (GLOVE) ×1
GLOVE SURG UNDER POLY LF SZ8.5 (GLOVE) ×2 IMPLANT
GOWN STRL REUS W/TWL LRG LVL3 (GOWN DISPOSABLE) ×4 IMPLANT
GOWN STRL REUS W/TWL XL LVL3 (GOWN DISPOSABLE) ×2 IMPLANT
HANDPIECE INTERPULSE COAX TIP (DISPOSABLE) ×1
HOLDER FOLEY CATH W/STRAP (MISCELLANEOUS) IMPLANT
IMMOBILIZER KNEE 20 (SOFTGOODS) ×2
IMMOBILIZER KNEE 20 THIGH 36 (SOFTGOODS) ×1 IMPLANT
KIT TURNOVER KIT A (KITS) ×2 IMPLANT
MANIFOLD NEPTUNE II (INSTRUMENTS) ×2 IMPLANT
NS IRRIG 1000ML POUR BTL (IV SOLUTION) ×2 IMPLANT
PACK TOTAL KNEE CUSTOM (KITS) ×2 IMPLANT
PADDING CAST COTTON 6X4 STRL (CAST SUPPLIES) ×2 IMPLANT
PENCIL SMOKE EVACUATOR (MISCELLANEOUS) ×2 IMPLANT
PIN DRILL FIX HALF THREAD (BIT) ×2 IMPLANT
PIN FIX SIGMA LCS THRD HI (PIN) ×2 IMPLANT
PROTECTOR NERVE ULNAR (MISCELLANEOUS) ×2 IMPLANT
SET HNDPC FAN SPRY TIP SCT (DISPOSABLE) ×1 IMPLANT
STRIP CLOSURE SKIN 1/2X4 (GAUZE/BANDAGES/DRESSINGS) ×4 IMPLANT
SUT MNCRL AB 4-0 PS2 18 (SUTURE) ×2 IMPLANT
SUT STRATAFIX 0 PDS 27 VIOLET (SUTURE) ×2
SUT VIC AB 2-0 CT1 27 (SUTURE) ×3
SUT VIC AB 2-0 CT1 TAPERPNT 27 (SUTURE) ×3 IMPLANT
SUTURE STRATFX 0 PDS 27 VIOLET (SUTURE) ×1 IMPLANT
TIBIAL BASE ROT PLAT SZ 8 KNEE (Knees) ×2 IMPLANT
TRAY FOLEY MTR SLVR 16FR STAT (SET/KITS/TRAYS/PACK) ×2 IMPLANT
WATER STERILE IRR 1000ML POUR (IV SOLUTION) ×4 IMPLANT
WRAP KNEE MAXI GEL POST OP (GAUZE/BANDAGES/DRESSINGS) ×2 IMPLANT

## 2020-10-08 NOTE — Anesthesia Procedure Notes (Signed)
Spinal  Patient location during procedure: OR End time: 10/08/2020 8:11 AM Reason for block: surgical anesthesia Staffing Performed: resident/CRNA  Resident/CRNA: Lissa Morales, CRNA Preanesthetic Checklist Completed: patient identified, IV checked, site marked, risks and benefits discussed, surgical consent, monitors and equipment checked, pre-op evaluation and timeout performed Spinal Block Patient position: sitting Prep: Betadine and DuraPrep Patient monitoring: heart rate, continuous pulse ox and blood pressure Approach: midline Location: L4-5 Injection technique: single-shot Needle Needle type: Pencan  Needle gauge: 24 G Needle length: 9 cm Assessment Events: CSF return Additional Notes Expiration date of kit checked and confirmed. Patient tolerated procedure well, without complications.

## 2020-10-08 NOTE — Discharge Instructions (Addendum)
 Frank Aluisio, MD Total Joint Specialist [facility] [address] [place], [place] [address] [phone]  TOTAL KNEE REPLACEMENT POSTOPERATIVE DIRECTIONS    Knee Rehabilitation, Guidelines Following Surgery  Results after knee surgery are often greatly improved when you follow the exercise, range of motion and muscle strengthening exercises prescribed by your doctor. Safety measures are also important to protect the knee from further injury. If any of these exercises cause you to have increased pain or swelling in your knee joint, decrease the amount until you are comfortable again and slowly increase them. If you have problems or questions, call your caregiver or physical therapist for advice.   BLOOD CLOT PREVENTION . Take a 10 mg Xarelto once a day for three weeks following surgery. Then take an 81 mg Aspirin once a day for three weeks. Then discontinue Aspirin. . You may resume your vitamins/supplements once you have discontinued the Xarelto. . Do not take any NSAIDs (Advil, Aleve, Ibuprofen, Meloxicam, etc.) until you have discontinued the Xarelto.   HOME CARE INSTRUCTIONS  . Remove items at home which could result in a fall. This includes throw rugs or furniture in walking pathways.  . ICE to the affected knee as much as tolerated. Icing helps control swelling. If the swelling is well controlled you will be more comfortable and rehab easier. Continue to use ice on the knee for pain and swelling from surgery. You may notice swelling that will progress down to the foot and ankle. This is normal after surgery. Elevate the leg when you are not up walking on it.    . Continue to use the breathing machine which will help keep your temperature down. It is common for your temperature to cycle up and down following surgery, especially at night when you are not up moving around and exerting yourself. The breathing machine keeps your lungs expanded and your  temperature down. . Do not place pillow under the operative knee, focus on keeping the knee straight while resting  DIET You may resume your previous home diet once you are discharged from the hospital.  DRESSING / WOUND CARE / SHOWERING . Keep your bulky bandage on for 2 days. On the third post-operative day you may remove the Ace bandage and gauze. There is a waterproof adhesive bandage on your skin which will stay in place until your first follow-up appointment. Once you remove this you will not need to place another bandage . You may begin showering 3 days following surgery, but do not submerge the incision under water.  ACTIVITY For the first 5 days, the key is rest and control of pain and swelling . Do your home exercises twice a day starting on post-operative day 3. On the days you go to physical therapy, just do the home exercises once that day. . You should rest, ice and elevate the leg for 50 minutes out of every hour. Get up and walk/stretch for 10 minutes per hour. After 5 days you can increase your activity slowly as tolerated. . Walk with your walker as instructed. Use the walker until you are comfortable transitioning to a cane. Walk with the cane in the opposite hand of the operative leg. You may discontinue the cane once you are comfortable and walking steadily. . Avoid periods of inactivity such as sitting longer than an hour when not asleep. This helps prevent blood clots.  . You may discontinue the knee immobilizer once you are able to perform a straight leg   raise while lying down. . You may resume a sexual relationship in one month or when given the OK by your doctor.  . You may return to work once you are cleared by your doctor.  . Do not drive a car for 6 weeks or until released by your surgeon.  . Do not drive while taking narcotics.  TED HOSE STOCKINGS Wear the elastic stockings on both legs for three weeks following surgery during the day. You may remove them at night  for sleeping.  WEIGHT BEARING Weight bearing as tolerated with assist device (walker, cane, etc) as directed, use it as long as suggested by your surgeon or therapist, typically at least 4-6 weeks.  POSTOPERATIVE CONSTIPATION PROTOCOL Constipation - defined medically as fewer than three stools per week and severe constipation as less than one stool per week.  One of the most common issues patients have following surgery is constipation.  Even if you have a regular bowel pattern at home, your normal regimen is likely to be disrupted due to multiple reasons following surgery.  Combination of anesthesia, postoperative narcotics, change in appetite and fluid intake all can affect your bowels.  In order to avoid complications following surgery, here are some recommendations in order to help you during your recovery period.  . Colace (docusate) - Pick up an over-the-counter form of Colace or another stool softener and take twice a day as long as you are requiring postoperative pain medications.  Take with a full glass of water daily.  If you experience loose stools or diarrhea, hold the colace until you stool forms back up. If your symptoms do not get better within 1 week or if they get worse, check with your doctor. . Dulcolax (bisacodyl) - Pick up over-the-counter and take as directed by the product packaging as needed to assist with the movement of your bowels.  Take with a full glass of water.  Use this product as needed if not relieved by Colace only.  . MiraLax (polyethylene glycol) - Pick up over-the-counter to have on hand. MiraLax is a solution that will increase the amount of water in your bowels to assist with bowel movements.  Take as directed and can mix with a glass of water, juice, soda, coffee, or tea. Take if you go more than two days without a movement. Do not use MiraLax more than once per day. Call your doctor if you are still constipated or irregular after using this medication for 7 days  in a row.  If you continue to have problems with postoperative constipation, please contact the office for further assistance and recommendations.  If you experience "the worst abdominal pain ever" or develop nausea or vomiting, please contact the office immediatly for further recommendations for treatment.  ITCHING If you experience itching with your medications, try taking only a single pain pill, or even half a pain pill at a time.  You can also use Benadryl over the counter for itching or also to help with sleep.   MEDICATIONS See your medication summary on the "After Visit Summary" that the nursing staff will review with you prior to discharge.  You may have some home medications which will be placed on hold until you complete the course of blood thinner medication.  It is important for you to complete the blood thinner medication as prescribed by your surgeon.  Continue your approved medications as instructed at time of discharge.  PRECAUTIONS . If you experience chest pain or shortness of breath -   call 911 immediately for transfer to the hospital emergency department.  . If you develop a fever greater that 101 F, purulent drainage from wound, increased redness or drainage from wound, foul odor from the wound/dressing, or calf pain - CONTACT YOUR SURGEON.                                                   FOLLOW-UP APPOINTMENTS Make sure you keep all of your appointments after your operation with your surgeon and caregivers. You should call the office at the above phone number and make an appointment for approximately two weeks after the date of your surgery or on the date instructed by your surgeon outlined in the "After Visit Summary".  RANGE OF MOTION AND STRENGTHENING EXERCISES  Rehabilitation of the knee is important following a knee injury or an operation. After just a few days of immobilization, the muscles of the thigh which control the knee become weakened and shrink (atrophy). Knee  exercises are designed to build up the tone and strength of the thigh muscles and to improve knee motion. Often times heat used for twenty to thirty minutes before working out will loosen up your tissues and help with improving the range of motion but do not use heat for the first two weeks following surgery. These exercises can be done on a training (exercise) mat, on the floor, on a table or on a bed. Use what ever works the best and is most comfortable for you Knee exercises include:  . Leg Lifts - While your knee is still immobilized in a splint or cast, you can do straight leg raises. Lift the leg to 60 degrees, hold for 3 sec, and slowly lower the leg. Repeat 10-20 times 2-3 times daily. Perform this exercise against resistance later as your knee gets better.  . Quad and Hamstring Sets - Tighten up the muscle on the front of the thigh (Quad) and hold for 5-10 sec. Repeat this 10-20 times hourly. Hamstring sets are done by pushing the foot backward against an object and holding for 5-10 sec. Repeat as with quad sets.   Leg Slides: Lying on your back, slowly slide your foot toward your buttocks, bending your knee up off the floor (only go as far as is comfortable). Then slowly slide your foot back down until your leg is flat on the floor again.  Angel Wings: Lying on your back spread your legs to the side as far apart as you can without causing discomfort.  A rehabilitation program following serious knee injuries can speed recovery and prevent re-injury in the future due to weakened muscles. Contact your doctor or a physical therapist for more information on knee rehabilitation.   IF YOU ARE TRANSFERRED TO A SKILLED REHAB FACILITY If the patient is transferred to a skilled rehab facility following release from the hospital, a list of the current medications will be sent to the facility for the patient to continue.  When discharged from the skilled rehab facility, please have the facility set up the  patient's Home Health Physical Therapy prior to being released. Also, the skilled facility will be responsible for providing the patient with their medications at time of release from the facility to include their pain medication, the muscle relaxants, and their blood thinner medication. If the patient is still at the rehab facility   at time of the two week follow up appointment, the skilled rehab facility will also need to assist the patient in arranging follow up appointment in our office and any transportation needs.  MAKE SURE YOU:  . Understand these instructions.  . Get help right away if you are not doing well or get worse.   DENTAL ANTIBIOTICS:  In most cases prophylactic antibiotics for Dental procdeures after total joint surgery are not necessary.  Exceptions are as follows:  1. History of prior total joint infection  2. Severely immunocompromised (Organ Transplant, cancer chemotherapy, Rheumatoid biologic meds such as Humera)  3. Poorly controlled diabetes (A1C &gt; 8.0, blood glucose over 200)  If you have one of these conditions, contact your surgeon for an antibiotic prescription, prior to your dental procedure.    Pick up stool softner and laxative for home use following surgery while on pain medications. Do not submerge incision under water. Please use good hand washing techniques while changing dressing each day. May shower starting three days after surgery. Please use a clean towel to pat the incision dry following showers. Continue to use ice for pain and swelling after surgery. Do not use any lotions or creams on the incision until instructed by your surgeon.    Information on my medicine - XARELTO (Rivaroxaban)   Why was Xarelto prescribed for you? Xarelto was prescribed for you to reduce the risk of blood clots forming after orthopedic surgery. The medical term for these abnormal blood clots is venous thromboembolism (VTE).  What do you need to know  about xarelto ? Take your Xarelto ONCE DAILY at the same time every day. You may take it either with or without food.  If you have difficulty swallowing the tablet whole, you may crush it and mix in applesauce just prior to taking your dose.  Take Xarelto exactly as prescribed by your doctor and DO NOT stop taking Xarelto without talking to the doctor who prescribed the medication.  Stopping without other VTE prevention medication to take the place of Xarelto may increase your risk of developing a clot.  After discharge, you should have regular check-up appointments with your healthcare provider that is prescribing your Xarelto.    What do you do if you miss a dose? If you miss a dose, take it as soon as you remember on the same day then continue your regularly scheduled once daily regimen the next day. Do not take two doses of Xarelto on the same day.   Important Safety Information A possible side effect of Xarelto is bleeding. You should call your healthcare provider right away if you experience any of the following: ? Bleeding from an injury or your nose that does not stop. ? Unusual colored urine (red or dark brown) or unusual colored stools (red or black). ? Unusual bruising for unknown reasons. ? A serious fall or if you hit your head (even if there is no bleeding).  Some medicines may interact with Xarelto and might increase your risk of bleeding while on Xarelto. To help avoid this, consult your healthcare provider or pharmacist prior to using any new prescription or non-prescription medications, including herbals, vitamins, non-steroidal anti-inflammatory drugs (NSAIDs) and supplements.  This website has more information on Xarelto: www.xarelto.com.   

## 2020-10-08 NOTE — Transfer of Care (Signed)
Immediate Anesthesia Transfer of Care Note  Patient: Jordan [name]  Procedure(s) Performed: TOTAL KNEE ARTHROPLASTY (Right Knee)  Patient Location: PACU  Anesthesia Type:Spinal  Level of Consciousness: awake, alert , oriented and patient cooperative  Airway & Oxygen Therapy: Patient Spontanous Breathing and Patient connected to face mask oxygen  Post-op Assessment: Report given to RN and Post -op Vital signs reviewed and stable  Post vital signs: stable  Last Vitals:  Vitals Value Taken Time  BP 114/73 10/08/20 0946  Temp 36.5 C 10/08/20 0946  Pulse 63 10/08/20 0953  Resp 22 10/08/20 0953  SpO2 100 % 10/08/20 0953  Vitals shown include unvalidated device data.  Last Pain:  Vitals:   10/08/20 0609  PainSc: 3       Patients Stated Pain Goal: 3 (02/25/47 1856)  Complications: No complications documented.

## 2020-10-08 NOTE — Op Note (Signed)
OPERATIVE REPORT-TOTAL KNEE ARTHROPLASTY   Pre-operative diagnosis- Osteoarthritis  Right knee(s)  Post-operative diagnosis- Osteoarthritis Right knee(s)  Procedure-  Right  Total Knee Arthroplasty  Surgeon- Dione Plover. Brita Jurgensen, MD  Assistant- Molli Barrows, PA-C   Anesthesia-  Adductor canal block and spinal  EBL-50 mL   Drains None  Tourniquet time-  Total Tourniquet Time Documented: Thigh (Right) - 42 minutes Total: Thigh (Right) - 42 minutes     Complications- None  Condition-PACU - hemodynamically stable.   Brief Clinical Note  Jordan [name] is a 70 y.o. year old female with end stage OA of her right knee with progressively worsening pain and dysfunction. She has constant pain, with activity and at rest and significant functional deficits with difficulties even with ADLs. She has had extensive non-op management including analgesics, injections of cortisone and viscosupplements, and home exercise program, but remains in significant pain with significant dysfunction.Radiographs show bone on bone arthritis medial and patellofemoral. She presents now for right Total Knee Arthroplasty.    Procedure in detail---   The patient is brought into the operating room and positioned supine on the operating table. After successful administration of  Adductor canal block and spinal,   a tourniquet is placed high on the  Right thigh(s) and the lower extremity is prepped and draped in the usual sterile fashion. Time out is performed by the operating team and then the  Right lower extremity is wrapped in Esmarch, knee flexed and the tourniquet inflated to 300 mmHg.       A midline incision is made with a ten blade through the subcutaneous tissue to the level of the extensor mechanism. A fresh blade is used to make a medial parapatellar arthrotomy. Soft tissue over the proximal medial tibia is subperiosteally elevated to the joint line with a knife and into the semimembranosus bursa with a Cobb  elevator. Soft tissue over the proximal lateral tibia is elevated with attention being paid to avoiding the patellar tendon on the tibial tubercle. The patella is everted, knee flexed 90 degrees and the ACL and PCL are removed. Findings are bone on bone medial and patellofemoral with massive global osteophytes.        The drill is used to create a starting hole in the distal femur and the canal is thoroughly irrigated with sterile saline to remove the fatty contents. The 5 degree Right  valgus alignment guide is placed into the femoral canal and the distal femoral cutting block is pinned to remove 9 mm off the distal femur. Resection is made with an oscillating saw.      The tibia is subluxed forward and the menisci are removed. The extramedullary alignment guide is placed referencing proximally at the medial aspect of the tibial tubercle and distally along the second metatarsal axis and tibial crest. The block is pinned to remove 59mm off the more deficient medial  side. Resection is made with an oscillating saw. Size 8is the most appropriate size for the tibia and the proximal tibia is prepared with the modular drill and keel punch for that size.      The femoral sizing guide is placed and size 8 is most appropriate. Rotation is marked off the epicondylar axis and confirmed by creating a rectangular flexion gap at 90 degrees. The size 8 cutting block is pinned in this rotation and the anterior, posterior and chamfer cuts are made with the oscillating saw. The intercondylar block is then placed and that cut is made.  Trial size 8 tibial component, trial size 8 posterior stabilized femur and a 8  mm posterior stabilized rotating platform insert trial is placed. Full extension is achieved with excellent varus/valgus and anterior/posterior balance throughout full range of motion. The patella is everted and thickness measured to be 24  mm. Free hand resection is taken to 14 mm, a 38 template is placed, lug holes  are drilled, trial patella is placed, and it tracks normally. Osteophytes are removed off the posterior femur with the trial in place. All trials are removed and the cut bone surfaces prepared with pulsatile lavage. Cement is mixed and once ready for implantation, the size 8 tibial implant, size  8 posterior stabilized femoral component, and the size 38 patella are cemented in place and the patella is held with the clamp. The trial insert is placed and the knee held in full extension. The Exparel (20 ml mixed with 60 ml saline) is injected into the extensor mechanism, posterior capsule, medial and lateral gutters and subcutaneous tissues.  All extruded cement is removed and once the cement is hard the permanent 8 mm posterior stabilized rotating platform insert is placed into the tibial tray.      The wound is copiously irrigated with saline solution and the extensor mechanism closed with # 0 Stratofix suture. The tourniquet is released for a total tourniquet time of 42  minutes. Flexion against gravity is 135 degrees and the patella tracks normally. Subcutaneous tissue is closed with 2.0 vicryl and subcuticular with running 4.0 Monocryl. The incision is cleaned and dried and steri-strips and a bulky sterile dressing are applied. The limb is placed into a knee immobilizer and the patient is awakened and transported to recovery in stable condition.      Please note that a surgical assistant was a medical necessity for this procedure in order to perform it in a safe and expeditious manner. Surgical assistant was necessary to retract the ligaments and vital neurovascular structures to prevent injury to them and also necessary for proper positioning of the limb to allow for anatomic placement of the prosthesis.   Dione Plover Aadvika Konen, MD    10/08/2020, 9:20 AM

## 2020-10-08 NOTE — Interval H&P Note (Signed)
History and Physical Interval Note:  10/08/2020 6:33 AM  Jordan [name]  has presented today for surgery, with the diagnosis of right knee osteoarthritis.  The various methods of treatment have been discussed with the patient and family. After consideration of risks, benefits and other options for treatment, the patient has consented to  Procedure(s) with comments: TOTAL KNEE ARTHROPLASTY (Right) - 46min as a surgical intervention.  The patient's history has been reviewed, patient examined, no change in status, stable for surgery.  I have reviewed the patient's chart and labs.  Questions were answered to the patient's satisfaction.     Pilar Plate Graviela Nodal

## 2020-10-08 NOTE — Progress Notes (Signed)
Orthopedic Tech Progress Note Patient Details:  Jordan [name] 1951/07/12 [id number]  [name], female   DOB: 03-08-1951, 70 y.o.   MRN: [id number]   [name] 10/08/2020, 10:38 AM Patient placed in cpm in pacu @1030 

## 2020-10-08 NOTE — Progress Notes (Signed)
AssistedDr. Foster with right, ultrasound guided, adductor canal block. Side rails up, monitors on throughout procedure. See vital signs in flow sheet. Tolerated Procedure well.  

## 2020-10-08 NOTE — Anesthesia Procedure Notes (Signed)
Procedure Name: MAC Date/Time: 10/08/2020 8:04 AM Performed by: Lissa Morales, CRNA Pre-anesthesia Checklist: Patient identified, Emergency Drugs available, Suction available, Patient being monitored and Timeout performed Patient Re-evaluated:Patient Re-evaluated prior to induction Oxygen Delivery Method: Simple face mask Placement Confirmation: positive ETCO2

## 2020-10-08 NOTE — Anesthesia Procedure Notes (Signed)
Anesthesia Regional Block: Adductor canal block   Pre-Anesthetic Checklist: ,, timeout performed, Correct Patient, Correct Site, Correct Laterality, Correct Procedure, Correct Position, site marked, Risks and benefits discussed,  Surgical consent,  Pre-op evaluation,  At surgeon's request and post-op pain management  Laterality: Right  Prep: chloraprep       Needles:  Injection technique: Single-shot      Additional Needles:   Procedures:,,,, ultrasound used (permanent image in chart),,,,  Narrative:  Start time: 10/08/2020 7:23 AM End time: 10/08/2020 7:28 AM Injection made incrementally with aspirations every 5 mL.  Performed by: Personally  Anesthesiologist: Josephine Igo, MD  Additional Notes: Timeout performed. Patient sedated. Relevant anatomy ID'd using [place]. Incremental 2-48ml injection of LA with frequent aspiration. Patient tolerated procedure well.        Right Adductor Canal Block

## 2020-10-08 NOTE — Evaluation (Signed)
Physical Therapy Evaluation Patient Details Name: Jordan [name] MRN: [id number] DOB: April 01, 1951 Today's Date: 10/08/2020   History of Present Illness  Pt is 70 yo female s/p R TKA on 10/08/20.  Pt with medical hx including anemia, arthritis, HTN, thyroid CA with thyroid removed, and PONV  Clinical Impression  Pt is s/p R TKA resulting in the deficits listed below (see PT Problem List). Pt seen on DOS and was limited due to post op nausea.  She was able to sit EOB but unable to progress further.  Pain was tolerable, quad activation good, able to SLR, and with good knee ROM 0 to 85 degrees.  Pt expected to progress well as post op nausea clears.  Pt will have support at d/c but needs DME.  She does live in 2 level home but can stay on first floor if needed.  Pt was independent prior to admission. Pt will benefit from skilled PT to increase their independence and safety with mobility to allow discharge to the venue listed below.      Follow Up Recommendations Follow surgeon's recommendation for DC plan and follow-up therapies;Supervision for mobility/OOB    Equipment Recommendations  Rolling walker with 5" wheels;3in1 (PT)    Recommendations for Other Services       Precautions / Restrictions Precautions Precautions: Fall Required Braces or Orthoses: Knee Immobilizer - Right Knee Immobilizer - Right: Discontinue once straight leg raise with < 10 degree lag Restrictions Weight Bearing Restrictions: Yes RLE Weight Bearing: Weight bearing as tolerated      Mobility  Bed Mobility Overal bed mobility: Needs Assistance Bed Mobility: Supine to Sit;Sit to Supine     Supine to sit: Min assist Sit to supine: Min assist   General bed mobility comments: Min A for R LE for comfort    Transfers Overall transfer level: Needs assistance Equipment used: Rolling walker (2 wheeled) Transfers: Lateral/Scoot Transfers          Lateral/Scoot Transfers: Min guard General transfer comment:  Unable due to nausea.  Did do partial stand and lateral scoot to transfer toward [facility]  Ambulation/Gait                Stairs            Wheelchair Mobility    Modified Rankin (Stroke Patients Only)       Balance Overall balance assessment: Needs assistance Sitting-balance support: No upper extremity supported Sitting balance-Leahy Scale: Good         Standing balance comment: unable due to nausea                             Pertinent Vitals/Pain Pain Assessment: 0-10 Pain Score: 2  Pain Location: R knee Pain Descriptors / Indicators: Discomfort;Sore Pain Intervention(s): Limited activity within patient's tolerance;Monitored during session;Premedicated before session;Repositioned;Ice applied    Home Living Family/patient expects to be discharged to:: Private residence Living Arrangements: Alone Available Help at Discharge: Family;Available 24 hours/day Type of Home: House Home Access: Level entry     Home Layout: Multi-level;Bed/bath upstairs;Full bath on main level (Reports could sleep on couch if needed) Home Equipment: None      Prior Function Level of Independence: Independent               Hand Dominance        Extremity/Trunk Assessment   Upper Extremity Assessment Upper Extremity Assessment: Overall WFL for tasks assessed    Lower Extremity  Assessment Lower Extremity Assessment: RLE deficits/detail;LLE deficits/detail RLE Deficits / Details: Changes consistent with Post op TKA; ROM : hip and ankle WFL, knee 0 to 85 degrees; MMT: 3/5 throughout not further tested; Pt able to perform SLR; Sensation intact LLE Deficits / Details: WFL    Cervical / Trunk Assessment Cervical / Trunk Assessment: Normal  Communication   Communication: No difficulties  Cognition Arousal/Alertness: Awake/alert Behavior During Therapy: WFL for tasks assessed/performed Overall Cognitive Status: Within Functional Limits for tasks assessed                                         General Comments General comments (skin integrity, edema, etc.): VSS    Exercises Total Joint Exercises Ankle Circles/Pumps: AROM;Both;5 reps;Supine Quad Sets: AROM;Both;5 reps;Supine Heel Slides: AROM;5 reps;Supine;Right   Assessment/Plan    PT Assessment Patient needs continued PT services  PT Problem List Decreased strength;Decreased mobility;Decreased range of motion;Decreased activity tolerance;Decreased balance;Decreased knowledge of use of DME;Pain;Decreased knowledge of precautions       PT Treatment Interventions DME instruction;Therapeutic activities;Modalities;Gait training;Therapeutic exercise;Patient/family education;Stair training;Balance training;Functional mobility training    PT Goals (Current goals can be found in the Care Plan section)  Acute Rehab PT Goals Patient Stated Goal: return home PT Goal Formulation: With patient/family Time For Goal Achievement: 10/22/20 Potential to Achieve Goals: Good    Frequency 7X/week   Barriers to discharge        Co-evaluation               AM-PAC PT "6 Clicks" Mobility  Outcome Measure Help needed turning from your back to your side while in a flat bed without using bedrails?: A Little Help needed moving from lying on your back to sitting on the side of a flat bed without using bedrails?: A Little Help needed moving to and from a bed to a chair (including a wheelchair)?: A Lot Help needed standing up from a chair using your arms (e.g., wheelchair or bedside chair)?: A Lot Help needed to walk in hospital room?: A Lot Help needed climbing 3-5 steps with a railing? : A Lot 6 Click Score: 14    End of Session Equipment Utilized During Treatment: Gait belt Activity Tolerance: Other (comment) (limited due to nausea) Patient left: in bed;with call bell/phone within reach;with family/visitor present;with SCD's reapplied;with bed alarm set Nurse Communication:  Mobility status;Patient requests pain meds (limited by nausea) PT Visit Diagnosis: Difficulty in walking, not elsewhere classified (R26.2);Muscle weakness (generalized) (M62.81);Pain Pain - Right/Left: Right Pain - part of body: Knee    Time: 8242-3536 PT Time Calculation (min) (ACUTE ONLY): 30 min   Charges:   PT Evaluation $PT Eval Moderate Complexity: 1 Mod PT Treatments $Therapeutic Exercise: 8-22 mins        Abran Richard, PT Acute Rehab Services Pager [phone] [facility] [phone]    Karlton Lemon 10/08/2020, 3:05 PM

## 2020-10-08 NOTE — Addendum Note (Signed)
Addendum  created 10/08/20 1123 by Josephine Igo, MD   Intraprocedure Event edited

## 2020-10-08 NOTE — H&P (Signed)
TOTAL KNEE ADMISSION H&P  Patient is being admitted for right total knee arthroplasty.  Subjective:  Chief Complaint: Right knee pain.  HPI: Jordan [name], 70 y.o. female has a history of pain and functional disability in the right knee due to arthritis and has failed non-surgical conservative treatments for greater than 12 weeks to include NSAID's and/or analgesics and activity modification. Onset of symptoms was gradual, starting several years ago with gradually worsening course since that time. The patient noted no past surgery on the right knee.  Patient currently rates pain in the right knee at 6 out of 10 with activity. Patient has worsening of pain with activity and weight bearing and pain that interferes with activities of daily living. Patient has evidence of periarticular osteophytes and joint space narrowing by imaging studies. There is no active infection.  There are no problems to display for this patient.   Past Medical History:  Diagnosis Date  . Anemia   . Arthritis   . Cancer ([place])    thyroid removed  . COVID    08-02-20  . Hypertension   . Hypothyroidism   . PONV (postoperative nausea and vomiting)   . Pre-diabetes    per MD notes on chart pt. denies    Past Surgical History:  Procedure Laterality Date  . CHOLECYSTECTOMY    . FLEXOR TENOTOMY Left 12/28/2019   Procedure: FLEXOR TENOTOMY;  Surgeon: Tyson Babinski, DPM;  Location: [facility];  Service: Podiatry;  Laterality: Left;  . REPAIR EXTENSOR TENDON Left 12/28/2019   Procedure: EXTENSOR TENDON LENGTHENING;  Surgeon: Tyson Babinski, DPM;  Location: [facility];  Service: Podiatry;  Laterality: Left;  . THYROIDECTOMY    . TOE ARTHROPLASTY Bilateral 12/28/2019   Procedure: TOE ARTHROPLASTY RIGHT SECOND TOE AND LEFT SECOND TOE;  Surgeon: Tyson Babinski, DPM;  Location: [facility];  Service: Podiatry;  Laterality: Bilateral;    Prior to Admission medications   Medication Sig Start Date End Date Taking?  Authorizing Provider  calcium carbonate (OSCAL) 1500 (600 Ca) MG TABS tablet Take 600 mg of elemental calcium by mouth 2 (two) times daily with a meal.   Yes [provider]  cholecalciferol (VITAMIN D3) 25 MCG (1000 UNIT) tablet Take 1,000 Units by mouth daily.   Yes [provider]  levothyroxine (SYNTHROID) 150 MCG tablet Take 150 mcg by mouth daily before breakfast.   Yes [provider]  magnesium oxide (MAG-OX) 400 MG tablet Take 800 mg by mouth 2 (two) times daily.   Yes [provider]  meloxicam (MOBIC) 15 MG tablet Take 15 mg by mouth daily.   Yes [provider]  valsartan-hydrochlorothiazide (DIOVAN-HCT) 160-25 MG tablet Take 1 tablet by mouth daily. 11/07/19  Yes [provider]    Allergies  Allergen Reactions  . Codeine Nausea And Vomiting    Social History   Socioeconomic History  . Marital status: Widowed    Spouse name: Not on file  . Number of children: Not on file  . Years of education: Not on file  . Highest education level: Not on file  Occupational History  . Not on file  Tobacco Use  . Smoking status: Never Smoker  . Smokeless tobacco: Never Used  Vaping Use  . Vaping Use: Never used  Substance and Sexual Activity  . Alcohol use: Not Currently    Comment:  rare at holiday  . Drug use: Never  . Sexual activity: Yes  Other Topics Concern  . Not on file  Social  History Narrative  . Not on file   Social Determinants of Health   Financial Resource Strain: Not on file  Food Insecurity: Not on file  Transportation Needs: Not on file  Physical Activity: Not on file  Stress: Not on file  Social Connections: Not on file  Intimate Partner Violence: Not on file    Tobacco Use: Low Risk   . Smoking Tobacco Use: Never Smoker  . Smokeless Tobacco Use: Never Used   Social History   Substance and Sexual Activity  Alcohol Use Not Currently   Comment:  rare at holiday    History reviewed. No  pertinent family history.   Objective:  Physical Exam: Well nourished and well developed.  General: Alert and oriented x3, cooperative and pleasant, no acute distress.  Head: normocephalic, atraumatic, neck supple.  Eyes: EOMI.  Respiratory: breath sounds clear in all fields, no wheezing, rales, or rhonchi. Cardiovascular: Regular rate and rhythm, no murmurs, gallops or rubs.  Abdomen: non-tender to palpation and soft, normoactive bowel sounds. Musculoskeletal:  Bilateral Hip Exam:  The range of motion: normal without discomfort.    Right Knee Exam:  No effusion present. No swelling present.  The range of motion is: 10 to 110 degrees.  Marked crepitus on range of motion of the knee.  Medial greater than lateral joint line tenderness.  The knee is stable.    Left Knee Exam:  No effusion present. No swelling present.  The Range of motion is: 10 to 120 degrees.  Marked crepitus on range of motion of the knee.  Positive medial greater than lateral joint line tenderness.  The knee is stable.    The patient's sensation and motor function are intact in their lower extremities. Their distal pulses are 2+. The bilateral calves are soft and non-tender.     Vital signs in last 24 hours:    Imaging Review [facility] and lateral of the bilateral knees dated 10/2019 from Dr. Michail Sermon office in [place] demonstrate bone-on-bone arthritis in the medial and patellofemoral compartments with massive osteophyte formation throughout the right knee. There is tibial subluxation of bilateral knees.  Assessment/Plan:  End stage arthritis, right knee   The patient history, physical examination, clinical judgment of the provider and imaging studies are consistent with end stage degenerative joint disease of the right knee and total knee arthroplasty is deemed medically necessary. The treatment options including medical management, injection therapy arthroscopy and arthroplasty were discussed at length.  The risks and benefits of total knee arthroplasty were presented and reviewed. The risks due to aseptic loosening, infection, stiffness, patella tracking problems, thromboembolic complications and other imponderables were discussed. The patient acknowledged the explanation, agreed to proceed with the plan and consent was signed. Patient is being admitted for inpatient treatment for surgery, pain control, PT, OT, prophylactic antibiotics, VTE prophylaxis, progressive ambulation and ADLs and discharge planning. The patient is planning to be discharged home.   Patient's anticipated LOS is less than 2 midnights, meeting these requirements: - Younger than 70 - Lives within 1 hour of care - Has a competent adult at home to recover with post-op recover - NO history of  - Chronic pain requiring opiods  - Diabetes  - Coronary Artery Disease  - Heart failure  - Heart attack  - Stroke  - DVT/VTE  - Cardiac arrhythmia  - Respiratory Failure/COPD  - Renal failure  - Anemia  - Advanced Liver disease      Therapy Plans: Home Therapy (x2 weeks) followed by  Outpatient Therapy ([facility], [place]) Disposition: Shanon Ace (friend) Planned DVT Prophylaxis: Xarelto 10mg  DME Needed: Gil[facility] Rile, 3-in-1 PCP: Dr. Ralph Leyden (Clearance received) TXA: IV Allergies: Codeine (N/V) Anesthesia Concerns: N/V BMI: 34.9 Last HgbA1c: No  Ph[facility]  - Patient was instructed on what medications to stop prior to surgery. - Follow-up visit in 2 weeks with Dr. Wynelle Link - Begin physical therapy following surgery - Pre-operative lab work as pre-surgical testing - Prescriptions will be provided in hospital at time of discharge  Fenton Foy, [facility], PA-C Orthopedic Surgery [facility] [place]

## 2020-10-08 NOTE — Anesthesia Postprocedure Evaluation (Signed)
Anesthesia Post Note  Patient: Public house manager  Procedure(s) Performed: TOTAL KNEE ARTHROPLASTY (Right Knee)     Patient location during evaluation: PACU Anesthesia Type: Spinal Level of consciousness: oriented and awake and alert Pain management: pain level controlled Vital Signs Assessment: post-procedure vital signs reviewed and stable Respiratory status: spontaneous breathing, respiratory function stable and nonlabored ventilation Cardiovascular status: blood pressure returned to baseline and stable Postop Assessment: no headache, no backache, no apparent nausea or vomiting, spinal receding and patient able to bend at knees Anesthetic complications: no   No complications documented.  Last Vitals:  Vitals:   10/08/20 1045 10/08/20 1100  BP: 134/78 133/75  Pulse: 84 69  Resp: 16 16  Temp: 36.4 C   SpO2: 100% 100%    Last Pain:  Vitals:   10/08/20 1100  PainSc: Asleep                 Pawan Knechtel A.

## 2020-10-09 ENCOUNTER — Encounter (HOSPITAL_COMMUNITY): Payer: Self-pay | Admitting: Orthopedic Surgery

## 2020-10-09 LAB — BASIC METABOLIC PANEL
Anion gap: 9 (ref 5–15)
BUN: 19 mg/dL (ref 8–23)
CO2: 24 mmol/L (ref 22–32)
Calcium: 8.1 mg/dL — ABNORMAL LOW (ref 8.9–10.3)
Chloride: 102 mmol/L (ref 98–111)
Creatinine, Ser: 0.94 mg/dL (ref 0.44–1.00)
GFR, Estimated: >60 mL/min (ref >60)
Glucose, Bld: 120 mg/dL — ABNORMAL HIGH (ref 70–99)
Potassium: 3.4 mmol/L — ABNORMAL LOW (ref 3.5–5.1)
Sodium: 135 mmol/L (ref 135–145)

## 2020-10-09 LAB — CBC
HCT: 29 % — ABNORMAL LOW (ref 36.0–46.0)
Hemoglobin: 9.5 g/dL — ABNORMAL LOW (ref 12.0–15.0)
MCH: 28.1 pg (ref 26.0–34.0)
MCHC: 32.8 g/dL (ref 30.0–36.0)
MCV: 85.8 fL (ref 80.0–100.0)
Platelets: 158 10*3/uL (ref 150–400)
RBC: 3.38 MIL/uL — ABNORMAL LOW (ref 3.87–5.11)
RDW: 13.4 % (ref 11.5–15.5)
WBC: 9.7 10*3/uL (ref 4.0–10.5)
nRBC: 0 % (ref 0.0–0.2)

## 2020-10-09 MED ORDER — METHOCARBAMOL 500 MG PO TABS: 500.0000 mg | ORAL_TABLET | Freq: Four times a day (QID) | ORAL | 0 refills | Status: DC | PRN

## 2020-10-09 MED ORDER — HYDROMORPHONE HCL 2 MG PO TABS: 2.0000 mg | ORAL_TABLET | Freq: Four times a day (QID) | ORAL | 0 refills | Status: DC | PRN

## 2020-10-09 MED ORDER — GABAPENTIN 300 MG PO CAPS: ORAL_CAPSULE | ORAL | 0 refills | Status: DC

## 2020-10-09 MED ORDER — POTASSIUM CHLORIDE CRYS ER 20 MEQ PO TBCR
40.0000 meq | EXTENDED_RELEASE_TABLET | ORAL | Status: AC
  Administered 2020-10-09 (×2): 40 meq via ORAL
  Filled 2020-10-09 (×2): qty 2

## 2020-10-09 MED ORDER — RIVAROXABAN 10 MG PO TABS: 10.0000 mg | ORAL_TABLET | Freq: Every day | ORAL | 0 refills | Status: DC

## 2020-10-09 MED ORDER — TRAMADOL HCL 50 MG PO TABS: 50.0000 mg | ORAL_TABLET | Freq: Four times a day (QID) | ORAL | 0 refills | Status: DC | PRN

## 2020-10-09 NOTE — TOC Transition Note (Signed)
Transition of Care [facility]) - CM/SW Discharge Note   Patient Details  Name: Jordan [name] MRN: [id number] Date of Birth: April 22, 1951  Transition of Care [facility]) CM/SW Contact:  Lennart Pall, LCSW Phone Number: 10/09/2020, 10:12 AM   Clinical Narrative:    Met briefly with patient and confirming need for rw and 3n1- [facility] to supply. Plan for HHPT then transition to [place] via [facility].  No further TOC needs.   Final next level of care: [place] Barriers to Discharge: No Barriers Identified   Patient Goals and CMS Choice Patient states their goals for this hospitalization and ongoing recovery are:: return home      Discharge Placement                       Discharge Plan and Services                DME Arranged: 3-N-1,Walker rolling DME Agency: [employer]       HH Arranged: PT [place] Agency: Other - See comment ([facility]) Date HH Agency Contacted:  (referred prior to surgery)      Social Determinants of Health (SDOH) Interventions     Readmission Risk Interventions No flowsheet data found.

## 2020-10-09 NOTE — Progress Notes (Signed)
Physical Therapy Treatment Patient Details Name: Jordan [name] MRN: [id number] DOB: 1951/06/30 Today's Date: 10/09/2020    History of Present Illness Pt is 70 yo female s/p R TKA on 10/08/20.  Pt with medical hx including anemia, arthritis, HTN, thyroid CA with thyroid removed, and PONV    PT Comments    Pt progressing slowly d/t nausea, pain, fatigue. Will see how she does this pm, however feel she will likely require another day of PT for safe d/c home  Follow Up Recommendations  Follow surgeon's recommendation for DC plan and follow-up therapies;Supervision for mobility/OOB     Equipment Recommendations  Rolling walker with 5" wheels;3in1 (PT)    Recommendations for Other Services       Precautions / Restrictions Precautions Precautions: Fall;Knee Required Braces or Orthoses: Knee Immobilizer - Right Knee Immobilizer - Right: Discontinue once straight leg raise with < 10 degree lag Restrictions Weight Bearing Restrictions: No RLE Weight Bearing: Weight bearing as tolerated Other Position/Activity Restrictions: WBAT    Mobility  Bed Mobility Overal bed mobility: Needs Assistance Bed Mobility: Supine to Sit     Supine to sit: Min assist     General bed mobility comments: Min A for R LE for comfort, incr time, effortful transition    Transfers Overall transfer level: Needs assistance Equipment used: Rolling walker (2 wheeled) Transfers: Sit to/from Stand Sit to Stand: Min assist;From elevated surface         General transfer comment: cues for hand placement, RLE position and self assist  Ambulation/Gait Ambulation/Gait assistance: Min assist Gait Distance (Feet): 8 Feet Assistive device: Rolling walker (2 wheeled) Gait Pattern/deviations: Step-to pattern;Decreased stance time - right Gait velocity: decr   General Gait Details: cues for sequence, RW position. limited by pain, nausea, dizziness   Stairs             Wheelchair Mobility     Modified Rankin (Stroke Patients Only)       Balance                                            Cognition Arousal/Alertness: Awake/alert Behavior During Therapy: WFL for tasks assessed/performed Overall Cognitive Status: Within Functional Limits for tasks assessed                                        Exercises Total Joint Exercises Ankle Circles/Pumps: AROM;Both;5 reps;Supine Quad Sets: AROM;Both;5 reps;Supine Straight Leg Raises: Limitations Straight Leg Raises Limitations: unable d/t pain and weakness    General Comments        Pertinent Vitals/Pain Pain Assessment: 0-10 Pain Score: 7  Pain Location: R knee Pain Descriptors / Indicators: Discomfort;Sore Pain Intervention(s): Limited activity within patient's tolerance;Monitored during session;Premedicated before session;Repositioned    Home Living                      Prior Function            PT Goals (current goals can now be found in the care plan section) Acute Rehab PT Goals Patient Stated Goal: return home PT Goal Formulation: With patient/family Time For Goal Achievement: 10/22/20 Potential to Achieve Goals: Good Progress towards PT goals: Progressing toward goals    Frequency    7X/week  PT Plan Current plan remains appropriate    Co-evaluation              AM-PAC PT "6 Clicks" Mobility   Outcome Measure  Help needed turning from your back to your side while in a flat bed without using bedrails?: A Little Help needed moving from lying on your back to sitting on the side of a flat bed without using bedrails?: A Little Help needed moving to and from a bed to a chair (including a wheelchair)?: A Lot Help needed standing up from a chair using your arms (e.g., wheelchair or bedside chair)?: A Lot Help needed to walk in hospital room?: A Lot Help needed climbing 3-5 steps with a railing? : A Lot 6 Click Score: 14    End of Session  Equipment Utilized During Treatment: Gait belt;Right knee immobilizer Activity Tolerance: Patient limited by fatigue;Patient limited by pain;Treatment limited secondary to medical complications (Comment) Patient left: in chair;with call bell/phone within reach;with chair alarm set Nurse Communication: Mobility status PT Visit Diagnosis: Difficulty in walking, not elsewhere classified (R26.2);Muscle weakness (generalized) (M62.81);Pain Pain - Right/Left: Right Pain - part of body: Knee     Time: 6759-1638 PT Time Calculation (min) (ACUTE ONLY): 21 min  Charges:  $Gait Training: 8-22 mins                     Baxter Flattery, PT  Acute Rehab Dept ([place]) [phone] Pager [phone]  10/09/2020    [facility] 10/09/2020, 10:51 AM

## 2020-10-09 NOTE — Progress Notes (Signed)
   Subjective: 1 Day Post-Op Procedure(s) (LRB): TOTAL KNEE ARTHROPLASTY (Right) Patient reports pain as mild.   Patient seen in rounds by Dr. Wynelle Link. Patient is well, and has had no acute complaints or problems other than pain in the right knee. Denies chest pain, SOB, or calf pain. No issues overnight. Foley catheter to be removed this AM. We will continue therapy today.   Objective: Vital signs in last 24 hours: Temp:  [97.4 F (36.3 C)-98.8 F (37.1 C)] 98.4 F (36.9 C) (03/29 0527) Pulse Rate:  [56-84] 70 (03/29 0527) Resp:  [10-20] 16 (03/29 0527) BP: (107-143)/(65-85) 131/70 (03/29 0527) SpO2:  [92 %-100 %] 100 % (03/29 0527)  Intake/Output from previous day:  Intake/Output Summary (Last 24 hours) at 10/09/2020 0701 Last data filed at 10/09/2020 0620 Gross per 24 hour  Intake 4257.18 ml  Output 2125 ml  Net 2132.18 ml     Intake/Output this shift: No intake/output data recorded.  Labs: Recent Labs    10/09/20 0302  HGB 9.5*   Recent Labs    10/09/20 0302  WBC 9.7  RBC 3.38*  HCT 29.0*  PLT 158   Recent Labs    10/09/20 0302  NA 135  K 3.4*  CL 102  CO2 24  BUN 19  CREATININE 0.94  GLUCOSE 120*  CALCIUM 8.1*   No results for input(s): LABPT, INR in the last 72 hours.  Exam: General - Patient is Alert and Oriented Extremity - Neurologically intact Neurovascular intact Sensation intact distally Dorsiflexion/Plantar flexion intact Dressing - dressing C/D/I Motor Function - intact, moving foot and toes well on exam.   Past Medical History:  Diagnosis Date  . Anemia   . Arthritis   . Cancer ([place])    thyroid removed  . COVID    08-02-20  . Hypertension   . Hypothyroidism   . PONV (postoperative nausea and vomiting)   . Pre-diabetes    per MD notes on chart pt. denies    Assessment/Plan: 1 Day Post-Op Procedure(s) (LRB): TOTAL KNEE ARTHROPLASTY (Right) Principal Problem:   OA (osteoarthritis) of knee Active Problems:   Primary  osteoarthritis of right knee  Estimated body mass index is 34.79 kg/m as calculated from the following:   Height as of this encounter: 5' 9.5" (1.765 m).   Weight as of this encounter: 108.4 kg. Advance diet Up with therapy D/C IV fluids   Patient's anticipated LOS is less than 2 midnights, meeting these requirements: - Younger than 62 - Lives within 1 hour of care - Has a competent adult at home to recover with post-op recover - NO history of  - Chronic pain requiring opiods  - Diabetes  - Coronary Artery Disease  - Heart failure  - Heart attack  - Stroke  - DVT/VTE  - Cardiac arrhythmia  - Respiratory Failure/COPD  - Renal failure  - Anemia  - Advanced Liver disease  Potassium 3.4 this AM, two doses of 40 mEq KCl ordered.  DVT Prophylaxis - Xarelto Weight bearing as tolerated. Continue therapy.  Plan is to go Home after hospital stay. Possible discharge later today if progresses with therapy and is meeting her goals. Will have two weeks of HHPT followed by OPPT. Follow-up in the office in 2 weeks.  The PDMP database was reviewed today prior to any opioid medications being prescribed to this patient.  Theresa Duty, PA-C Orthopedic Surgery [phone] 10/09/2020, 7:01 AM

## 2020-10-09 NOTE — Plan of Care (Signed)
Plan of care discussed with pt.

## 2020-10-09 NOTE — Progress Notes (Signed)
Physical Therapy Treatment Patient Details Name: Jordan [name] MRN: [id number] DOB: [birth year] Today's Date: 10/09/2020    History of Present Illness Pt is 70 yo female s/p R TKA on 10/08/20.  Pt with medical hx including anemia, arthritis, HTN, thyroid CA with thyroid removed, and PONV    PT Comments    Pt progressing slowly, although incr amb distance this pm. Feel pt will need at least another day to meet goals and be ready for safe d/c home   Follow Up Recommendations  Follow surgeon's recommendation for DC plan and follow-up therapies;Supervision for mobility/OOB     Equipment Recommendations  Rolling walker with 5" wheels;3in1 (PT)    Recommendations for Other Services       Precautions / Restrictions Precautions Precautions: Fall;Knee Required Braces or Orthoses: Knee Immobilizer - Right Knee Immobilizer - Right: Discontinue once straight leg raise with < 10 degree lag Restrictions Weight Bearing Restrictions: No Other Position/Activity Restrictions: WBAT    Mobility  Bed Mobility Overal bed mobility: Needs Assistance Bed Mobility: Sit to Supine       Sit to supine: Min assist   General bed mobility comments: Min A for R LE for comfort, incr time, effortful transition    Transfers Overall transfer level: Needs assistance Equipment used: Rolling walker (2 wheeled) Transfers: Sit to/from Stand Sit to Stand: Min assist;Mod assist         General transfer comment: cues for hand placement, RLE position and self assist  Ambulation/Gait Ambulation/Gait assistance: Min assist Gait Distance (Feet): 15 Feet (x2) Assistive device: Rolling walker (2 wheeled) Gait Pattern/deviations: Step-to pattern;Decreased stance time - right Gait velocity: decr   General Gait Details: cues for sequence, RW position. limited by pain, nausea   Stairs             Wheelchair Mobility    Modified Rankin (Stroke Patients Only)       Balance                                             Cognition Arousal/Alertness: Awake/alert Behavior During Therapy: WFL for tasks assessed/performed Overall Cognitive Status: Within Functional Limits for tasks assessed                                        Exercises Total Joint Exercises Ankle Circles/Pumps: AROM;Both;5 reps;Supine Quad Sets: AROM;Both;5 reps;Supine Heel Slides: Right;10 reps;AAROM Straight Leg Raises: AAROM;Right;10 reps    General Comments        Pertinent Vitals/Pain Pain Assessment: 0-10 Pain Score: 8  Pain Location: R knee Pain Descriptors / Indicators: Discomfort;Sore Pain Intervention(s): Limited activity within patient's tolerance;Monitored during session;Repositioned;Premedicated before session    Home Living                      Prior Function            PT Goals (current goals can now be found in the care plan section) Acute Rehab PT Goals Patient Stated Goal: return home PT Goal Formulation: With patient/family Time For Goal Achievement: 10/22/20 Potential to Achieve Goals: Good Progress towards PT goals: Progressing toward goals    Frequency    7X/week      PT Plan Current plan remains appropriate    Co-evaluation  AM-PAC PT "6 Clicks" Mobility   Outcome Measure  Help needed turning from your back to your side while in a flat bed without using bedrails?: A Little Help needed moving from lying on your back to sitting on the side of a flat bed without using bedrails?: A Little Help needed moving to and from a bed to a chair (including a wheelchair)?: A Lot Help needed standing up from a chair using your arms (e.g., wheelchair or bedside chair)?: A Lot Help needed to walk in hospital room?: A Lot Help needed climbing 3-5 steps with a railing? : A Lot 6 Click Score: 14    End of Session Equipment Utilized During Treatment: Gait belt;Right knee immobilizer Activity Tolerance: Patient limited  by fatigue Patient left: with call bell/phone within reach;in bed;with bed alarm set Nurse Communication: Mobility status PT Visit Diagnosis: Difficulty in walking, not elsewhere classified (R26.2);Muscle weakness (generalized) (M62.81);Pain Pain - Right/Left: Right Pain - part of body: Knee     Time: 6213-0865 PT Time Calculation (min) (ACUTE ONLY): 25 min  Charges:  $Gait Training: 8-22 mins $Therapeutic Exercise: 8-22 mins                     Baxter Flattery, PT  Acute Rehab Dept ([place]) [phone] Pager [phone]  10/09/2020    [facility] 10/09/2020, 4:12 PM

## 2020-10-10 DIAGNOSIS — Z85850 Personal history of malignant neoplasm of thyroid: Secondary | ICD-10-CM | POA: Diagnosis not present

## 2020-10-10 DIAGNOSIS — Z791 Long term (current) use of non-steroidal anti-inflammatories (NSAID): Secondary | ICD-10-CM | POA: Diagnosis not present

## 2020-10-10 DIAGNOSIS — I10 Essential (primary) hypertension: Secondary | ICD-10-CM | POA: Diagnosis present

## 2020-10-10 DIAGNOSIS — M25761 Osteophyte, right knee: Secondary | ICD-10-CM | POA: Diagnosis present

## 2020-10-10 DIAGNOSIS — I951 Orthostatic hypotension: Secondary | ICD-10-CM | POA: Diagnosis not present

## 2020-10-10 DIAGNOSIS — Z9049 Acquired absence of other specified parts of digestive tract: Secondary | ICD-10-CM | POA: Diagnosis not present

## 2020-10-10 DIAGNOSIS — E039 Hypothyroidism, unspecified: Secondary | ICD-10-CM | POA: Diagnosis present

## 2020-10-10 DIAGNOSIS — E669 Obesity, unspecified: Secondary | ICD-10-CM | POA: Diagnosis present

## 2020-10-10 DIAGNOSIS — Z79899 Other long term (current) drug therapy: Secondary | ICD-10-CM | POA: Diagnosis not present

## 2020-10-10 DIAGNOSIS — M1711 Unilateral primary osteoarthritis, right knee: Secondary | ICD-10-CM | POA: Diagnosis present

## 2020-10-10 DIAGNOSIS — E1165 Type 2 diabetes mellitus with hyperglycemia: Secondary | ICD-10-CM | POA: Diagnosis present

## 2020-10-10 DIAGNOSIS — Z885 Allergy status to narcotic agent status: Secondary | ICD-10-CM | POA: Diagnosis not present

## 2020-10-10 DIAGNOSIS — Z79890 Hormone replacement therapy (postmenopausal): Secondary | ICD-10-CM | POA: Diagnosis not present

## 2020-10-10 DIAGNOSIS — Z8616 Personal history of COVID-19: Secondary | ICD-10-CM | POA: Diagnosis not present

## 2020-10-10 DIAGNOSIS — Z6834 Body mass index (BMI) 34.0-34.9, adult: Secondary | ICD-10-CM | POA: Diagnosis not present

## 2020-10-10 LAB — BASIC METABOLIC PANEL
Anion gap: 9 (ref 5–15)
BUN: 16 mg/dL (ref 8–23)
CO2: 26 mmol/L (ref 22–32)
Calcium: 8.4 mg/dL — ABNORMAL LOW (ref 8.9–10.3)
Chloride: 101 mmol/L (ref 98–111)
Creatinine, Ser: 0.89 mg/dL (ref 0.44–1.00)
GFR, Estimated: >60 mL/min (ref >60)
Glucose, Bld: 111 mg/dL — ABNORMAL HIGH (ref 70–99)
Potassium: 3.8 mmol/L (ref 3.5–5.1)
Sodium: 136 mmol/L (ref 135–145)

## 2020-10-10 LAB — CBC
HCT: 30.2 % — ABNORMAL LOW (ref 36.0–46.0)
Hemoglobin: 9.7 g/dL — ABNORMAL LOW (ref 12.0–15.0)
MCH: 27.6 pg (ref 26.0–34.0)
MCHC: 32.1 g/dL (ref 30.0–36.0)
MCV: 85.8 fL (ref 80.0–100.0)
Platelets: 169 10*3/uL (ref 150–400)
RBC: 3.52 MIL/uL — ABNORMAL LOW (ref 3.87–5.11)
RDW: 13.7 % (ref 11.5–15.5)
WBC: 10.3 10*3/uL (ref 4.0–10.5)
nRBC: 0 % (ref 0.0–0.2)

## 2020-10-10 NOTE — Progress Notes (Signed)
Physical Therapy Treatment Patient Details Name: Jordan [name] MRN: [id number] DOB: 1951-07-10 Today's Date: 10/10/2020    History of Present Illness Pt is 70 yo female s/p R TKA on 10/08/20.  Pt with medical hx including anemia, arthritis, HTN, thyroid CA with thyroid removed, and PONV    PT Comments    Pt progressing, incr activity tolerance/gait distance this pm. Reviewed HEP.  Pt concerned about d/c home. Has family assist through wknd, will likely be ready to d/c after PT sessions tomorrow.   Follow Up Recommendations  Follow surgeon's recommendation for DC plan and follow-up therapies;Supervision for mobility/OOB     Equipment Recommendations  Rolling walker with 5" wheels;3in1 (PT)    Recommendations for Other Services       Precautions / Restrictions Precautions Precautions: Fall;Knee Required Braces or Orthoses: Knee Immobilizer - Right Knee Immobilizer - Right: Discontinue once straight leg raise with < 10 degree lag Restrictions Weight Bearing Restrictions: No Other Position/Activity Restrictions: WBAT    Mobility  Bed Mobility Overal bed mobility: Needs Assistance Bed Mobility: Sit to Supine       Sit to supine: Min assist   General bed mobility comments: Min assist for R LE , incr time, effortful transition.    Transfers Overall transfer level: Needs assistance Equipment used: Rolling walker (2 wheeled) Transfers: Sit to/from Stand Sit to Stand: Min assist;Min guard;From elevated surface         General transfer comment: cues for hand placement, RLE position and self assist  Ambulation/Gait Ambulation/Gait assistance: Min guard Gait Distance (Feet): 80 Feet Assistive device: Rolling walker (2 wheeled) Gait Pattern/deviations: Step-to pattern;Decreased stance time - right Gait velocity: decr   General Gait Details: cues for sequence, RW position. limited by pain, nausea   Stairs             Wheelchair Mobility    Modified Rankin  (Stroke Patients Only)       Balance                                            Cognition Arousal/Alertness: Awake/alert Behavior During Therapy: WFL for tasks assessed/performed Overall Cognitive Status: Within Functional Limits for tasks assessed                                        Exercises Total Joint Exercises Ankle Circles/Pumps: AROM;Both;5 reps;Supine Quad Sets: AROM;Both;10 reps Heel Slides: Right;10 reps;AAROM Straight Leg Raises: AAROM;Right;10 reps Hip AB/ADDuction RLE AAROM x 10   General Comments        Pertinent Vitals/Pain Pain Assessment: 0-10 Pain Score: 4  Pain Location: R knee Pain Descriptors / Indicators: Discomfort;Sore Pain Intervention(s): Limited activity within patient's tolerance;Monitored during session;Premedicated before session;Repositioned    Home Living                      Prior Function            PT Goals (current goals can now be found in the care plan section) Acute Rehab PT Goals Patient Stated Goal: return home PT Goal Formulation: With patient/family Time For Goal Achievement: 10/22/20 Potential to Achieve Goals: Good Progress towards PT goals: Progressing toward goals    Frequency    7X/week      PT Plan Current  plan remains appropriate    Co-evaluation              AM-PAC PT "6 Clicks" Mobility   Outcome Measure  Help needed turning from your back to your side while in a flat bed without using bedrails?: A Little Help needed moving from lying on your back to sitting on the side of a flat bed without using bedrails?: A Little Help needed moving to and from a bed to a chair (including a wheelchair)?: A Lot Help needed standing up from a chair using your arms (e.g., wheelchair or bedside chair)?: A Lot Help needed to walk in hospital room?: A Lot Help needed climbing 3-5 steps with a railing? : A Lot 6 Click Score: 14    End of Session Equipment Utilized  During Treatment: Gait belt;Right knee immobilizer Activity Tolerance: Patient tolerated treatment well Patient left: with call bell/phone within reach;in bed;with bed alarm set Nurse Communication: Mobility status PT Visit Diagnosis: Difficulty in walking, not elsewhere classified (R26.2);Muscle weakness (generalized) (M62.81);Pain Pain - Right/Left: Right Pain - part of body: Knee     Time: 1457-1530 PT Time Calculation (min) (ACUTE ONLY): 33 min  Charges:  $Gait Training: 8-22 mins $Therapeutic Exercise: 8-22 mins                     Baxter Flattery, PT  Acute Rehab Dept ([place]) [phone] Pager [phone]  10/10/2020    [facility] 10/10/2020, 4:30 PM

## 2020-10-10 NOTE — Progress Notes (Signed)
   Subjective: 2 Days Post-Op Procedure(s) (LRB): TOTAL KNEE ARTHROPLASTY (Right) Patient reports pain as mild.   Patient seen in rounds by Dr. Wynelle Link. Patient is well, and has had no acute complaints or problems. Had slower progression with therapy, prompting continued stay. No issues overnight. Denies chest pain or SOB. Plan is to go Home after hospital stay.  Objective: Vital signs in last 24 hours: Temp:  [98.2 F (36.8 C)-99.5 F (37.5 C)] 99.1 F (37.3 C) (03/30 0620) Pulse Rate:  [64-83] 75 (03/30 0620) Resp:  [14-16] 16 (03/30 0620) BP: (127-186)/(66-85) 136/69 (03/30 0620) SpO2:  [97 %-98 %] 97 % (03/30 0620)  Intake/Output from previous day:  Intake/Output Summary (Last 24 hours) at 10/10/2020 0719 Last data filed at 10/10/2020 1761 Gross per 24 hour  Intake 600 ml  Output 2000 ml  Net -1400 ml    Intake/Output this shift: No intake/output data recorded.  Labs: Recent Labs    10/09/20 0302 10/10/20 0258  HGB 9.5* 9.7*   Recent Labs    10/09/20 0302 10/10/20 0258  WBC 9.7 10.3  RBC 3.38* 3.52*  HCT 29.0* 30.2*  PLT 158 169   Recent Labs    10/09/20 0302 10/10/20 0258  NA 135 136  K 3.4* 3.8  CL 102 101  CO2 24 26  BUN 19 16  CREATININE 0.94 0.89  GLUCOSE 120* 111*  CALCIUM 8.1* 8.4*   No results for input(s): LABPT, INR in the last 72 hours.  Exam: General - Patient is Alert and Oriented Extremity - Neurologically intact Neurovascular intact Sensation intact distally Dorsiflexion/Plantar flexion intact Dressing/Incision - clean, dry, no drainage Motor Function - intact, moving foot and toes well on exam.   Past Medical History:  Diagnosis Date  . Anemia   . Arthritis   . Cancer ([place])    thyroid removed  . COVID    08-02-20  . Hypertension   . Hypothyroidism   . PONV (postoperative nausea and vomiting)   . Pre-diabetes    per MD notes on chart pt. denies    Assessment/Plan: 2 Days Post-Op Procedure(s) (LRB): TOTAL KNEE  ARTHROPLASTY (Right) Principal Problem:   OA (osteoarthritis) of knee Active Problems:   Primary osteoarthritis of right knee  Estimated body mass index is 34.79 kg/m as calculated from the following:   Height as of this encounter: 5' 9.5" (1.765 m).   Weight as of this encounter: 108.4 kg. Up with therapy D/C IV fluids  DVT Prophylaxis - Xarelto Weight-bearing as tolerated  Plan for discharge later today if meeting goals with PT.  Theresa Duty, PA-C Orthopedic Surgery [phone] 10/10/2020, 7:19 AM

## 2020-10-10 NOTE — Plan of Care (Signed)
Plan of care discussed with pt.

## 2020-10-10 NOTE — Progress Notes (Signed)
Orthopedic Tech Progress Note Patient Details:  Jordan [name] [birth year] [id number]  CPM Right Knee CPM Right Knee: On Right Knee Flexion (Degrees): 40 Right Knee Extension (Degrees): 10  Post Interventions Patient Tolerated: Well Instructions Provided: Adjustment of device,Care of device  [place] Pink 10/10/2020, 6:49 PM

## 2020-10-10 NOTE — Progress Notes (Signed)
Notified Jordan [name] that pt does not feel comfortable going home today. No new orders. Pt will stay another night.

## 2020-10-10 NOTE — Progress Notes (Signed)
Physical Therapy Treatment Patient Details Name: Jordan [name] MRN: [id number] DOB: 1951-01-09 Today's Date: 10/10/2020    History of Present Illness Pt is 70 yo female s/p R TKA on 10/08/20.  Pt with medical hx including anemia, arthritis, HTN, thyroid CA with thyroid removed, and PONV    PT Comments    Pt continues to progress slowly although improved all aspects of mobility today. Anxious regarding d/c home  Follow Up Recommendations  Follow surgeon's recommendation for DC plan and follow-up therapies;Supervision for mobility/OOB     Equipment Recommendations  Rolling walker with 5" wheels;3in1 (PT)    Recommendations for Other Services       Precautions / Restrictions Precautions Precautions: Fall;Knee Required Braces or Orthoses: Knee Immobilizer - Right Knee Immobilizer - Right: Discontinue once straight leg raise with < 10 degree lag Restrictions Weight Bearing Restrictions: No RLE Weight Bearing: Weight bearing as tolerated Other Position/Activity Restrictions: WBAT    Mobility  Bed Mobility Overal bed mobility: Needs Assistance Bed Mobility: Sit to Supine       Sit to supine: Min guard   General bed mobility comments: Min guard for R LE for comfort, incr time, effortful transition. pt able to use gait belt to self assist    Transfers Overall transfer level: Needs assistance Equipment used: Rolling walker (2 wheeled) Transfers: Sit to/from Stand Sit to Stand: Min assist;Min guard;From elevated surface         General transfer comment: cues for hand placement, RLE position and self assist  Ambulation/Gait Ambulation/Gait assistance: Min guard;Min assist Gait Distance (Feet): 35 Feet Assistive device: Rolling walker (2 wheeled) Gait Pattern/deviations: Step-to pattern;Decreased stance time - right Gait velocity: decr   General Gait Details: cues for sequence, RW position. limited by pain, nausea   Stairs             Wheelchair Mobility     Modified Rankin (Stroke Patients Only)       Balance                                            Cognition Arousal/Alertness: Awake/alert Behavior During Therapy: WFL for tasks assessed/performed Overall Cognitive Status: Within Functional Limits for tasks assessed                                        Exercises Total Joint Exercises Ankle Circles/Pumps: AROM;Both;5 reps;Supine    General Comments        Pertinent Vitals/Pain Pain Assessment: 0-10 Pain Score: 4  Pain Location: R knee Pain Descriptors / Indicators: Discomfort;Sore Pain Intervention(s): Limited activity within patient's tolerance;Monitored during session;Premedicated before session;Repositioned;Ice applied    Home Living                      Prior Function            PT Goals (current goals can now be found in the care plan section) Acute Rehab PT Goals Patient Stated Goal: return home PT Goal Formulation: With patient/family Time For Goal Achievement: 10/22/20 Potential to Achieve Goals: Good Progress towards PT goals: Progressing toward goals    Frequency    7X/week      PT Plan Current plan remains appropriate    Co-evaluation  AM-PAC PT "6 Clicks" Mobility   Outcome Measure  Help needed turning from your back to your side while in a flat bed without using bedrails?: A Little Help needed moving from lying on your back to sitting on the side of a flat bed without using bedrails?: A Little Help needed moving to and from a bed to a chair (including a wheelchair)?: A Lot Help needed standing up from a chair using your arms (e.g., wheelchair or bedside chair)?: A Lot Help needed to walk in hospital room?: A Lot Help needed climbing 3-5 steps with a railing? : A Lot 6 Click Score: 14    End of Session Equipment Utilized During Treatment: Gait belt;Right knee immobilizer Activity Tolerance: Patient limited by  fatigue Patient left: with call bell/phone within reach;in chair;with chair alarm set Nurse Communication: Mobility status PT Visit Diagnosis: Difficulty in walking, not elsewhere classified (R26.2);Muscle weakness (generalized) (M62.81);Pain Pain - Right/Left: Right Pain - part of body: Knee     Time: 3151-7616 PT Time Calculation (min) (ACUTE ONLY): 19 min  Charges:  $Gait Training: 8-22 mins                     Baxter Flattery, PT  Acute Rehab Dept ([place]) [phone] Pager [phone]  10/10/2020    [facility] 10/10/2020, 12:56 PM

## 2020-10-11 ENCOUNTER — Encounter (HOSPITAL_COMMUNITY): Payer: Self-pay | Admitting: Orthopedic Surgery

## 2020-10-11 LAB — CBC
HCT: 27.9 % — ABNORMAL LOW (ref 36.0–46.0)
Hemoglobin: 9.2 g/dL — ABNORMAL LOW (ref 12.0–15.0)
MCH: 28.4 pg (ref 26.0–34.0)
MCHC: 33 g/dL (ref 30.0–36.0)
MCV: 86.1 fL (ref 80.0–100.0)
Platelets: 150 10*3/uL (ref 150–400)
RBC: 3.24 MIL/uL — ABNORMAL LOW (ref 3.87–5.11)
RDW: 13.7 % (ref 11.5–15.5)
WBC: 8.5 10*3/uL (ref 4.0–10.5)
nRBC: 0 % (ref 0.0–0.2)

## 2020-10-11 NOTE — Progress Notes (Signed)
Physical Therapy Treatment Patient Details Name: Jordan [name] MRN: [id number] DOB: 1950-10-06 Today's Date: 10/11/2020    History of Present Illness Pt is 70 yo female s/p R TKA on 10/08/20.  Pt with medical hx including anemia, arthritis, HTN, thyroid CA with thyroid removed, and PONV    PT Comments    POD # 3 am session Near syncope/fall after amb 8 feet in hallway.  Pt amb twice yesterday in hallway 35 feet in am and 80 feet in pm.  This morning pt stated she slept good.  Was self able to slide R LE off bed using strap.  Self able to rise OOB but after getting just outside the doorway, pt started to c/o feeling "hot".  Pt diaphoretic.  Pt started to sway forward and was not verbally responding.  Called for help and quickly assisted pt to close by computer chair as knees buckled then to recliner.  Quickly placed in supine position and returned to room with RN and NT taking vitals.  Pt did arouse still c/o feeling "hot" and "woozy" but not dizzy or nausea. Assisted back to bed via "stretcher slide" technique, cooled with wet wash cloths and pt asked for a Coke.  I was not expecting this from POD #3.  Will see later this afternoon   Follow Up Recommendations  Follow surgeon's recommendation for DC plan and follow-up therapies;Supervision for mobility/OOB     Equipment Recommendations  Rolling walker with 5" wheels;3in1 (PT)    Recommendations for Other Services       Precautions / Restrictions Precautions Precautions: Fall;Knee Precaution Comments: instructed no pillow under knee Restrictions Weight Bearing Restrictions: No RLE Weight Bearing: Weight bearing as tolerated    Mobility  Bed Mobility Overal bed mobility: Needs Assistance Bed Mobility: Supine to Sit;Sit to Supine     Supine to sit: Min assist Sit to supine: Total assist   General bed mobility comments: asisted OOB with increased time demonstarted and instructed how to use strap to self assist LE.  Total Assist  back to bed due to episode.    Transfers Overall transfer level: Needs assistance Equipment used: Rolling walker (2 wheeled) Transfers: Sit to/from [employer] Sit to Stand: Min guard;Min assist Stand pivot transfers: Min assist       General transfer comment: cues for hand placement, RLE position and self assist  Ambulation/Gait Ambulation/Gait assistance: Min guard Gait Distance (Feet): 8 Feet Assistive device: Rolling walker (2 wheeled) Gait Pattern/deviations: Step-to pattern;Decreased stance time - right Gait velocity: decr   General Gait Details: pt started off good but after 8 feet c/o feeling "hot" and was visibly diaphoretic.  Near fall/syncope as I called for help and RN pulled computer chair under pt before her knees buckled.  Then quickly assisted to recliner.  Pt briefly verbally unresponsive.  Vitals taken by RN   Stairs             Wheelchair Mobility    Modified Rankin (Stroke Patients Only)       Balance                                            Cognition Arousal/Alertness: Awake/alert Behavior During Therapy: WFL for tasks assessed/performed Overall Cognitive Status: Within Functional Limits for tasks assessed  General Comments: AxO x 3 very pleasant lady and willing      Exercises      General Comments        Pertinent Vitals/Pain Pain Assessment: 0-10 Pain Score: 4  Pain Location: R knee Pain Descriptors / Indicators: Discomfort;Sore    Home Living                      Prior Function            PT Goals (current goals can now be found in the care plan section) Progress towards PT goals: Progressing toward goals    Frequency    7X/week      PT Plan Current plan remains appropriate    Co-evaluation              AM-PAC PT "6 Clicks" Mobility   Outcome Measure  Help needed turning from your back to your side while in a  flat bed without using bedrails?: A Little Help needed moving from lying on your back to sitting on the side of a flat bed without using bedrails?: A Little Help needed moving to and from a bed to a chair (including a wheelchair)?: A Little Help needed standing up from a chair using your arms (e.g., wheelchair or bedside chair)?: A Little Help needed to walk in hospital room?: A Little Help needed climbing 3-5 steps with a railing? : A Lot 6 Click Score: 17    End of Session Equipment Utilized During Treatment: Gait belt Activity Tolerance: Patient tolerated treatment well Patient left: with call bell/phone within reach;in bed;with bed alarm set Nurse Communication: Mobility status PT Visit Diagnosis: Difficulty in walking, not elsewhere classified (R26.2);Muscle weakness (generalized) (M62.81);Pain Pain - Right/Left: Right Pain - part of body: Knee     Time: 0916-0950 PT Time Calculation (min) (ACUTE ONLY): 34 min  Charges:  $Gait Training: 8-22 mins $Therapeutic Activity: 8-22 mins                     Rica Koyanagi  PTA Acute  Rehabilitation Services Pager      [phone] Office      [phone]

## 2020-10-11 NOTE — Progress Notes (Signed)
Physical Therapy Treatment Patient Details Name: Jordan [name] MRN: [id number] DOB: Feb 10, 1951 Today's Date: 10/11/2020    History of Present Illness Pt is 70 yo female s/p R TKA on 10/08/20.  Pt with medical hx including anemia, arthritis, HTN, thyroid CA with thyroid removed, and PONV    PT Comments    POD # pm session  Near syncope again this afternoon.  This time I took Orthostatic vitals while leaving pt standing EOB. Supine:   BP 126/63     HR 83 Standing: BP 9656       HR  88 "I have that same feeling"  Hot/swimmy.... Attempted standing 3 min   BP 59/34  HR 80 EOB seated RN called to room to assit pt back to bed.    Pt stated she has been taking Folic Acid > 10 years since her Thyroid Cancer and has been off of it for about a week due to surgery.  Pt wanted to know if that had anything to do with it?   Pt stated she had had mild dizzy spells before where she would have to stop and sit when she would miss a dose.  Follow Up Recommendations  Follow surgeon's recommendation for DC plan and follow-up therapies;Supervision for mobility/OOB     Equipment Recommendations  Rolling walker with 5" wheels;3in1 (PT)    Recommendations for Other Services       Precautions / Restrictions Precautions Precautions: Fall;Knee Precaution Comments: instructed no pillow under knee Restrictions Weight Bearing Restrictions: No RLE Weight Bearing: Weight bearing as tolerated    Mobility  Bed Mobility Overal bed mobility: Needs Assistance Bed Mobility: Supine to Sit;Sit to Supine     Supine to sit: Min assist Sit to supine: Total assist   General bed mobility comments: asisted OOB with increased time demonstarted and instructed how to use strap to self assist LE.  Total Assist back to bed due to episode.    Transfers Overall transfer level: Needs assistance Equipment used: Rolling walker (2 wheeled) Transfers: Sit to/from [facility] Sit to Stand: Min guard;Min  assist Stand pivot transfers: Min assist       General transfer comment: cues for hand placement, RLE position and self assist  Ambulation/Gait   General Gait Details: did not amb this session due to orthostatic hypotension   Stairs             Wheelchair Mobility    Modified Rankin (Stroke Patients Only)       Balance                                            Cognition Arousal/Alertness: Awake/alert Behavior During Therapy: WFL for tasks assessed/performed Overall Cognitive Status: Within Functional Limits for tasks assessed                                 General Comments: AxO x 3 very pleasant lady and willing      Exercises      General Comments        Pertinent Vitals/Pain Pain Assessment: 0-10 Pain Score: 4  Pain Location: R knee Pain Descriptors / Indicators: Discomfort;Sore    Home Living                      Prior Function  PT Goals (current goals can now be found in the care plan section) Progress towards PT goals: Progressing toward goals    Frequency    7X/week      PT Plan Current plan remains appropriate    Co-evaluation              AM-PAC PT "6 Clicks" Mobility   Outcome Measure  Help needed turning from your back to your side while in a flat bed without using bedrails?: A Little Help needed moving from lying on your back to sitting on the side of a flat bed without using bedrails?: A Little Help needed moving to and from a bed to a chair (including a wheelchair)?: A Little Help needed standing up from a chair using your arms (e.g., wheelchair or bedside chair)?: A Little Help needed to walk in hospital room?: A Little Help needed climbing 3-5 steps with a railing? : A Lot 6 Click Score: 17    End of Session Equipment Utilized During Treatment: Gait belt Activity Tolerance: Patient tolerated treatment well Patient left: with call bell/phone within reach;in  bed;with bed alarm set Nurse Communication: Mobility status PT Visit Diagnosis: Difficulty in walking, not elsewhere classified (R26.2);Muscle weakness (generalized) (M62.81);Pain Pain - Right/Left: Right Pain - part of body: Knee     Time: 1405-1430 PT Time Calculation (min) (ACUTE ONLY): 25 min  Charges:  $Therapeutic Activity: 23-37 mins                     Rica Koyanagi  PTA Acute  Rehabilitation Services Pager      [phone] Office      [phone]

## 2020-10-11 NOTE — Progress Notes (Signed)
Physical Therapy  Near syncope again this afternoon.  This time I took Orthostatic vitals while leaving pt standing EOB. Supine:   BP 126/63     HR 83 Standing: BP 9656       HR  88 "I have that same feeling"  Hot/swimmy.... Attempted standing 3 min   BP 59/34  HR 80 RN called to room to assit pt back to bed.    Pt stated she has been taking Folic Acid > 10 years since her Thyroid Cancer and has been off of it for about a week due to surgery.  Pt wanted to know if that had anything to do with it?   Pt stated she had had mild dizzy spells before where she would have to stop and sit when she would miss a dose.   Rica Koyanagi  PTA Acute  Rehabilitation Services Pager      [phone] Office      [phone]

## 2020-10-11 NOTE — Progress Notes (Addendum)
PHYSICAL THERAPY  Near syncope/fall after amb 8 feet in hallway.  Pt amb twice yesterday in hallway 35 feet in am and 80 feet in pm.  This morning pt stated she slept good.  Was self able to slide R LE off bed using strap.  Self able to rise OOB but after getting just outside the doorway, pt started to c/o feeling "hot".  Pt diaphoretic.  Pt started to sway forward and was not verbally responding.  Called for help and quickly assisted pt to close by computer chair as knees buckled then to recliner.  Quickly placed in supine position and returned to room with RN and NT taking vitals.  Pt did arouse still c/o feeling "hot" and "woozy" but not dizzy or nausea. Assisted back to bed via "stretcher slide" technique, cooled with wet wash cloths and pt asked for a Coke.  I was not expecting this from POD #3.  Will see later this afternoon  Rica Koyanagi  PTA Acute  Rehabilitation Services Pager      [phone] Office      [phone]

## 2020-10-11 NOTE — Progress Notes (Signed)
   Subjective: 3 Days Post-Op Procedure(s) (LRB): TOTAL KNEE ARTHROPLASTY (Right) Patient reports pain as mild.   Patient seen in rounds by Dr. Wynelle Link. Patient is well, and has had no acute complaints or problems. No acute overnight events. Denies SOB, chest pain, or calf pain. Ambulated 80 feet with PT yesterday. Will continue PT today.  Plan is to go Home after hospital stay.  Objective: Vital signs in last 24 hours: Temp:  [98.4 F (36.9 C)-99.2 F (37.3 C)] 99.2 F (37.3 C) (03/31 0440) Pulse Rate:  [68-80] 80 (03/31 0440) Resp:  [16-18] 16 (03/31 0440) BP: (105-141)/(68-89) 119/68 (03/31 0440) SpO2:  [87 %-100 %] 95 % (03/31 0440)  Intake/Output from previous day:  Intake/Output Summary (Last 24 hours) at 10/11/2020 0718 Last data filed at 10/11/2020 0600 Gross per 24 hour  Intake 570 ml  Output 850 ml  Net -280 ml    Intake/Output this shift: No intake/output data recorded.  Labs: Recent Labs    10/09/20 0302 10/10/20 0258 10/11/20 0224  HGB 9.5* 9.7* 9.2*   Recent Labs    10/10/20 0258 10/11/20 0224  WBC 10.3 8.5  RBC 3.52* 3.24*  HCT 30.2* 27.9*  PLT 169 150   Recent Labs    10/09/20 0302 10/10/20 0258  NA 135 136  K 3.4* 3.8  CL 102 101  CO2 24 26  BUN 19 16  CREATININE 0.94 0.89  GLUCOSE 120* 111*  CALCIUM 8.1* 8.4*   No results for input(s): LABPT, INR in the last 72 hours.  Exam: General - Patient is Alert and Oriented Extremity - Neurologically intact Neurovascular intact Intact pulses distally Dorsiflexion/Plantar flexion intact Dressing/Incision - clean, dry, no drainage Motor Function - intact, moving foot and toes well on exam.   Past Medical History:  Diagnosis Date  . Anemia   . Arthritis   . Cancer ([place])    thyroid removed  . COVID    08-02-20  . Hypertension   . Hypothyroidism   . PONV (postoperative nausea and vomiting)   . Pre-diabetes    per MD notes on chart pt. denies    Assessment/Plan: 3 Days Post-Op  Procedure(s) (LRB): TOTAL KNEE ARTHROPLASTY (Right) Principal Problem:   OA (osteoarthritis) of knee Active Problems:   Primary osteoarthritis of right knee  Estimated body mass index is 34.79 kg/m as calculated from the following:   Height as of this encounter: 5' 9.5" (1.765 m).   Weight as of this encounter: 108.4 kg. Up with therapy  DVT Prophylaxis - Xarelto Weight-bearing as tolerated  Plan for two sessions with PT today, and if meeting goals, will plan for discharge this afternoon.   Patient to follow up with Dr. Wynelle Link in clinic on Tuesday April 12.   The PDMP database was reviewed today prior to any opioid medications being prescribed to this patient.  Fenton Foy, MBA, PA-C Orthopedic Surgery 10/11/2020, 7:18 AM

## 2020-10-12 MED ORDER — SODIUM CHLORIDE 0.9 % IV BOLUS
250.0000 mL | Freq: Once | INTRAVENOUS | Status: AC
  Administered 2020-10-12: 250 mL via INTRAVENOUS

## 2020-10-12 MED ORDER — FOLIC ACID 1 MG PO TABS
1.0000 mg | ORAL_TABLET | Freq: Every day | ORAL | Status: DC
  Administered 2020-10-12 – 2020-10-14 (×3): 1 mg via ORAL
  Filled 2020-10-12 (×3): qty 1

## 2020-10-12 MED ORDER — MAGNESIUM OXIDE 400 (241.3 MG) MG PO TABS
800.0000 mg | ORAL_TABLET | Freq: Two times a day (BID) | ORAL | Status: DC
Start: 2020-10-12 — End: 2020-10-14
  Administered 2020-10-12 – 2020-10-14 (×5): 800 mg via ORAL
  Filled 2020-10-12 (×5): qty 2

## 2020-10-12 NOTE — Progress Notes (Signed)
Writer paged [facility] Citron patient being dizzy and hot  with therapy, See new orders

## 2020-10-12 NOTE — Progress Notes (Signed)
Physical Therapy Treatment Patient Details Name: Jordan [name] MRN: [id number] DOB: 1950-09-29 Today's Date: 10/12/2020    History of Present Illness Pt is 70 yo female s/p R TKA on 10/08/20.  Pt with medical hx including anemia, arthritis, HTN, thyroid CA with thyroid removed, and PONV    PT Comments    Pt progressing however became mildly diaphoretic and required seated rest. See flowsheets  For BPs. Will see again in pm.   Follow Up Recommendations  Follow surgeon's recommendation for DC plan and follow-up therapies;Supervision for mobility/OOB     Equipment Recommendations  Rolling walker with 5" wheels;3in1 (PT)    Recommendations for Other Services       Precautions / Restrictions Precautions Precautions: Fall;Knee Required Braces or Orthoses: Knee Immobilizer - Right Knee Immobilizer - Right: Discontinue once straight leg raise with < 10 degree lag Restrictions Weight Bearing Restrictions: No RLE Weight Bearing: Weight bearing as tolerated    Mobility  Bed Mobility Overal bed mobility: Needs Assistance Bed Mobility: Supine to Sit     Supine to sit: Min assist     General bed mobility comments: light assist with RLE to EOB, pt self assisting  holding KI    Transfers Overall transfer level: Needs assistance Equipment used: Rolling walker (2 wheeled) Transfers: Sit to/from Stand Sit to Stand: Min guard         General transfer comment: cues for hand placement, RLE position and self assist  Ambulation/Gait Ambulation/Gait assistance: Min guard;Supervision Gait Distance (Feet): 80 Feet Assistive device: Rolling walker (2 wheeled) Gait Pattern/deviations: Step-to pattern;Decreased stance time - right Gait velocity: decr   General Gait Details: pt again felt dizzy/hot after amb distance above. see flowsheets for  BPs. still had diastolic drop in standing and low BP after session.   Stairs             Wheelchair Mobility    Modified Rankin  (Stroke Patients Only)       Balance                                            Cognition Arousal/Alertness: Awake/alert Behavior During Therapy: WFL for tasks assessed/performed Overall Cognitive Status: Within Functional Limits for tasks assessed                                        Exercises Total Joint Exercises Ankle Circles/Pumps: AROM;10 reps;Both    General Comments        Pertinent Vitals/Pain Pain Assessment: 0-10 Pain Location: R knee Pain Descriptors / Indicators: Discomfort;Sore Pain Intervention(s): Monitored during session;Limited activity within patient's tolerance;Premedicated before session;Repositioned    Home Living                      Prior Function            PT Goals (current goals can now be found in the care plan section) Acute Rehab PT Goals Patient Stated Goal: return home PT Goal Formulation: With patient/family Time For Goal Achievement: 10/22/20 Potential to Achieve Goals: Good Progress towards PT goals: Progressing toward goals    Frequency    7X/week      PT Plan Current plan remains appropriate    Co-evaluation  AM-PAC PT "6 Clicks" Mobility   Outcome Measure  Help needed turning from your back to your side while in a flat bed without using bedrails?: A Little Help needed moving from lying on your back to sitting on the side of a flat bed without using bedrails?: A Little Help needed moving to and from a bed to a chair (including a wheelchair)?: A Little Help needed standing up from a chair using your arms (e.g., wheelchair or bedside chair)?: A Little Help needed to walk in hospital room?: A Little Help needed climbing 3-5 steps with a railing? : A Lot 6 Click Score: 17    End of Session Equipment Utilized During Treatment: Gait belt Activity Tolerance: Patient tolerated treatment well Patient left: in chair;with call bell/phone within reach;with chair  alarm set Nurse Communication: Mobility status PT Visit Diagnosis: Difficulty in walking, not elsewhere classified (R26.2);Muscle weakness (generalized) (M62.81);Pain Pain - Right/Left: Right Pain - part of body: Knee     Time: 2951-8841 PT Time Calculation (min) (ACUTE ONLY): 23 min  Charges:  $Gait Training: 8-22 mins $Therapeutic Activity: 8-22 mins                     Baxter Flattery, PT  Acute Rehab Dept ([place]) [phone] Pager [phone]  10/12/2020    [facility] 10/12/2020, 12:49 PM

## 2020-10-12 NOTE — Care Management Important Message (Signed)
Important Message  Patient Details IM Letter given to the Patient Name: Jordan [name] MRN: [id number] Date of Birth: September 16, 1950   Medicare Important Message Given:  Yes     Kerin Salen 10/12/2020, 10:53 AM

## 2020-10-12 NOTE — Progress Notes (Addendum)
Physical Therapy Treatment Patient Details Name: Jordan [name] MRN: [id number] DOB: March 09, 1951 Today's Date: 10/12/2020    History of Present Illness Pt is 70 yo female s/p R TKA on 10/08/20.  Pt with medical hx including anemia, arthritis, HTN, thyroid CA with thyroid removed, and PONV    PT Comments    Pt progressing with PT  however still reported feeling "hot" and dizzy after amb. incr distance but similar  Symptoms/diaphoresis continue, too much pain for full TKA HEP review. Given medical issues it seems pt is again not ready for d/c today. BP meds were held and pt restarted her vitamins today also. Continue PT POC    Follow Up Recommendations  Follow surgeon's recommendation for DC plan and follow-up therapies;Supervision for mobility/OOB     Equipment Recommendations  Rolling walker with 5" wheels;3in1 (PT)    Recommendations for Other Services       Precautions / Restrictions Precautions Precautions: Fall;Knee Required Braces or Orthoses: Knee Immobilizer - Right Knee Immobilizer - Right: Discontinue once straight leg raise with < 10 degree lag Restrictions Weight Bearing Restrictions: No RLE Weight Bearing: Weight bearing as tolerated Other Position/Activity Restrictions: WBAT    Mobility  Bed Mobility Overal bed mobility: Needs Assistance Bed Mobility: Supine to Sit     Supine to sit: Min assist     General bed mobility comments: light assist with RLE to EOB, pt self assisting  holding KI    Transfers Overall transfer level: Needs assistance Equipment used: Rolling walker (2 wheeled) Transfers: Sit to/from Stand Sit to Stand: Min guard;From elevated surface         General transfer comment: cues for hand placement, RLE position and self assist  Ambulation/Gait Ambulation/Gait assistance: Min guard;Supervision Gait Distance (Feet): 120 Feet Assistive device: Rolling walker (2 wheeled) Gait Pattern/deviations: Step-to pattern;Decreased stance time -  right Gait velocity: decr   General Gait Details: min/guard to supervision for safety. mild dizziness, c/o feeling "hot" after seated.after above distance   [employer] Rankin (Stroke Patients Only)       Balance                                            Cognition Arousal/Alertness: Awake/alert Behavior During Therapy: WFL for tasks assessed/performed Overall Cognitive Status: Within Functional Limits for tasks assessed                                        Exercises Total Joint Exercises Ankle Circles/Pumps: AROM;10 reps;Both Quad Sets: AROM;Right;Limitations (3 reps) Quad Sets Limitations: pain Heel Slides: Limitations;AROM (3 reps) Heel Slides Limitations: pain, pt reported it was causing diaphoresis    General Comments        Pertinent Vitals/Pain Pain Score: 4  Pain Location: R knee Pain Descriptors / Indicators: Discomfort;Sore Pain Intervention(s): Limited activity within patient's tolerance;Monitored during session;Repositioned;Ice applied    Home Living                      Prior Function            PT Goals (current goals can now be found in the care plan section) Acute Rehab PT Goals Patient Stated  Goal: return home PT Goal Formulation: With patient/family Time For Goal Achievement: 10/22/20 Potential to Achieve Goals: Good Progress towards PT goals: Progressing toward goals    Frequency    7X/week      PT Plan Current plan remains appropriate    Co-evaluation              AM-PAC PT "6 Clicks" Mobility   Outcome Measure  Help needed turning from your back to your side while in a flat bed without using bedrails?: A Little Help needed moving from lying on your back to sitting on the side of a flat bed without using bedrails?: A Little Help needed moving to and from a bed to a chair (including a wheelchair)?: A Little Help needed standing  up from a chair using your arms (e.g., wheelchair or bedside chair)?: A Little Help needed to walk in hospital room?: A Little Help needed climbing 3-5 steps with a railing? : A Lot 6 Click Score: 17    End of Session Equipment Utilized During Treatment: Gait belt Activity Tolerance: Patient tolerated treatment well Patient left: in chair;with call bell/phone within reach;with chair alarm set Nurse Communication: Mobility status PT Visit Diagnosis: Difficulty in walking, not elsewhere classified (R26.2);Muscle weakness (generalized) (M62.81);Pain Pain - Right/Left: Right Pain - part of body: Knee     Time: 0947-0962 PT Time Calculation (min) (ACUTE ONLY): 16 min  Charges:  $Gait Training: 8-22 mins                     Baxter Flattery, PT  Acute Rehab Dept ([place]) [phone] Pager [phone]  10/12/2020    [facility] 10/12/2020, 4:36 PM

## 2020-10-12 NOTE — Progress Notes (Signed)
   Subjective: 4 Days Post-Op Procedure(s) (LRB): TOTAL KNEE ARTHROPLASTY (Right) Patient reports pain as mild.   Patient seen in rounds by Dr. Wynelle Link. Patient is well, and has had no acute complaints or problems. Reports she is feeling better this AM, had issues with orthostatic hypotension yesterday while ambulating and received a bolus. She believes this happens occasionally when she discontinues her folic acid and magnesium, we restarted both of these for today. Also discontinued the gabapentin to avoid dizziness.  Plan is to go Home after hospital stay.  Objective: Vital signs in last 24 hours: Temp:  [97.9 F (36.6 C)-99.1 F (37.3 C)] 98.4 F (36.9 C) (04/01 0541) Pulse Rate:  [74-79] 77 (04/01 0541) Resp:  [16-18] 18 (04/01 0541) BP: (103-132)/(61-71) 111/63 (04/01 0541) SpO2:  [96 %-100 %] 97 % (04/01 0541)  Intake/Output from previous day:  Intake/Output Summary (Last 24 hours) at 10/12/2020 0716 Last data filed at 10/12/2020 0600 Gross per 24 hour  Intake 570 ml  Output 300 ml  Net 270 ml    Intake/Output this shift: No intake/output data recorded.  Labs: Recent Labs    10/10/20 0258 10/11/20 0224  HGB 9.7* 9.2*   Recent Labs    10/10/20 0258 10/11/20 0224  WBC 10.3 8.5  RBC 3.52* 3.24*  HCT 30.2* 27.9*  PLT 169 150   Recent Labs    10/10/20 0258  NA 136  K 3.8  CL 101  CO2 26  BUN 16  CREATININE 0.89  GLUCOSE 111*  CALCIUM 8.4*   No results for input(s): LABPT, INR in the last 72 hours.  Exam: General - Patient is Alert and Oriented Extremity - Neurologically intact Neurovascular intact Sensation intact distally Dorsiflexion/Plantar flexion intact Dressing/Incision - clean, dry, no drainage Motor Function - intact, moving foot and toes well on exam.   Past Medical History:  Diagnosis Date  . Anemia   . Arthritis   . Cancer ([place])    thyroid removed  . COVID    08-02-20  . Hypertension   . Hypothyroidism   . PONV (postoperative  nausea and vomiting)   . Pre-diabetes    per MD notes on chart pt. denies    Assessment/Plan: 4 Days Post-Op Procedure(s) (LRB): TOTAL KNEE ARTHROPLASTY (Right) Principal Problem:   OA (osteoarthritis) of knee Active Problems:   Primary osteoarthritis of right knee  Estimated body mass index is 34.79 kg/m as calculated from the following:   Height as of this encounter: 5' 9.5" (1.765 m).   Weight as of this encounter: 108.4 kg. Advance diet Up with therapy D/C IV fluids  DVT Prophylaxis - Xarelto Weight-bearing as tolerated  Plan for discharge later today if meeting goals with therapy and blood pressures stable while ambulating.  Theresa Duty, PA-C Orthopedic Surgery [phone] 10/12/2020, 7:16 AM

## 2020-10-13 LAB — BASIC METABOLIC PANEL
Anion gap: 11 (ref 5–15)
BUN: 15 mg/dL (ref 8–23)
CO2: 23 mmol/L (ref 22–32)
Calcium: 8.1 mg/dL — ABNORMAL LOW (ref 8.9–10.3)
Chloride: 101 mmol/L (ref 98–111)
Creatinine, Ser: 0.9 mg/dL (ref 0.44–1.00)
GFR, Estimated: >60 mL/min (ref >60)
Glucose, Bld: 148 mg/dL — ABNORMAL HIGH (ref 70–99)
Potassium: 4 mmol/L (ref 3.5–5.1)
Sodium: 135 mmol/L (ref 135–145)

## 2020-10-13 LAB — CBC
HCT: 30.9 % — ABNORMAL LOW (ref 36.0–46.0)
Hemoglobin: 10 g/dL — ABNORMAL LOW (ref 12.0–15.0)
MCH: 27.6 pg (ref 26.0–34.0)
MCHC: 32.4 g/dL (ref 30.0–36.0)
MCV: 85.4 fL (ref 80.0–100.0)
Platelets: 195 10*3/uL (ref 150–400)
RBC: 3.62 MIL/uL — ABNORMAL LOW (ref 3.87–5.11)
RDW: 13.2 % (ref 11.5–15.5)
WBC: 8.5 10*3/uL (ref 4.0–10.5)
nRBC: 0 % (ref 0.0–0.2)

## 2020-10-13 NOTE — Progress Notes (Signed)
Orthopedic Tech Progress Note Patient Details:  Jordan [name] 12-24-1950 [id number]  Patient ID: [name], female   DOB: [birth year], 70 y.o.   MRN: [id number]   [name] 10/13/2020, 7:25 PM cpm removed @1920  per patient request

## 2020-10-13 NOTE — Plan of Care (Signed)
  Problem: Pain Management: Goal: Pain level will decrease with appropriate interventions Outcome: Progressing   Problem: Skin Integrity: Goal: Will show signs of wound healing Outcome: Progressing   Problem: Activity: Goal: Range of joint motion will improve Outcome: Progressing   Problem: Activity: Goal: Ability to avoid complications of mobility impairment will improve Outcome: Progressing

## 2020-10-13 NOTE — Plan of Care (Signed)
  Problem: Education: Goal: Knowledge of General Education information will improve Description Including pain rating scale, medication(s)/side effects and non-pharmacologic comfort measures Outcome: Progressing   

## 2020-10-13 NOTE — Progress Notes (Signed)
Orthopedics Progress Note  Subjective: Feeling better this morning.   Objective:  Vitals:   10/12/20 2038 10/13/20 0635  BP: 136/74 115/66  Pulse: 88 73  Resp: 12 16  Temp: (!) 100.4 F (38 C) 98.8 F (37.1 C)  SpO2: 97% 99%    General: Awake and alert  Musculoskeletal:Right knee dressing CDI, compartments supple. Neg [place] Neurovascularly intact  Lab Results  Component Value Date   WBC 8.5 10/11/2020   HGB 9.2 (L) 10/11/2020   HCT 27.9 (L) 10/11/2020   MCV 86.1 10/11/2020   PLT 150 10/11/2020       Component Value Date/Time   NA 136 10/10/2020 0258   K 3.8 10/10/2020 0258   CL 101 10/10/2020 0258   CO2 26 10/10/2020 0258   GLUCOSE 111 (H) 10/10/2020 0258   BUN 16 10/10/2020 0258   CREATININE 0.89 10/10/2020 0258   CALCIUM 8.4 (L) 10/10/2020 0258   GFRNONAA >60 10/10/2020 0258    Lab Results  Component Value Date   INR 1.0 09/27/2020   INR 1.0 08/02/2020    Assessment/Plan: POD #5 s/p Procedure(s): TOTAL KNEE ARTHROPLASTY Home today once clears therapy as long as no symptomatic hypotension   Doran Heater. Veverly Fells, MD 10/13/2020 6:52 AM

## 2020-10-13 NOTE — Progress Notes (Signed)
Physical Therapy Treatment Patient Details Name: Jordan [name] MRN: [id number] DOB: 09-Apr-1951 Today's Date: 10/13/2020    History of Present Illness Pt is 70 yo female s/p R TKA on 10/08/20.  Pt with medical hx including anemia, arthritis, HTN, thyroid CA with thyroid removed, and PONV    PT Comments    Pt  Able to incr gait distance, feeling better than am.  Unable to do exercises as pt  Wanted to  stay in the bathroom for a bit. NT aware. Pt to be in CPM later.  Will continue to follow.   Follow Up Recommendations  Follow surgeon's recommendation for DC plan and follow-up therapies;Supervision for mobility/OOB     Equipment Recommendations  Rolling walker with 5" wheels;3in1 (PT)    Recommendations for Other Services       Precautions / Restrictions Precautions Precautions: Fall;Knee Precaution Booklet Issued: No Precaution Comments: instructed no pillow under knee Required Braces or Orthoses: Knee Immobilizer - Right Knee Immobilizer - Right: Discontinue once straight leg raise with < 10 degree lag Restrictions Weight Bearing Restrictions: No Other Position/Activity Restrictions: WBAT    Mobility  Bed Mobility               General bed mobility comments: in recliner on arrival    Transfers   Equipment used: Rolling walker (2 wheeled) Transfers: Sit to/from Stand Sit to Stand: Min guard         General transfer comment: cues for hand placement, RLE position and self assist  Ambulation/Gait Ambulation/Gait assistance: Supervision Gait Distance (Feet): 130 Feet Assistive device: Rolling walker (2 wheeled) Gait Pattern/deviations: Step-to pattern;Decreased stance time - right     General Gait Details: supervision for safety. pt reported feeling mildly warm but improved from am session (after amb)   Stairs             Wheelchair Mobility    Modified Rankin (Stroke Patients Only)       Balance                                             Cognition Arousal/Alertness: Awake/alert Behavior During Therapy: WFL for tasks assessed/performed Overall Cognitive Status: Within Functional Limits for tasks assessed                                        Exercises Total Joint Exercises Ankle Circles/Pumps: AROM;10 reps;Both    General Comments        Pertinent Vitals/Pain Pain Assessment: Faces Faces Pain Scale: Hurts a little bit Pain Location: R knee Pain Descriptors / Indicators: Discomfort;Sore Pain Intervention(s): Limited activity within patient's tolerance;Monitored during session;Premedicated before session    Home Living                      Prior Function            PT Goals (current goals can now be found in the care plan section) Acute Rehab PT Goals Patient Stated Goal: return home PT Goal Formulation: With patient/family Time For Goal Achievement: 10/22/20 Potential to Achieve Goals: Good Progress towards PT goals: Progressing toward goals    Frequency    7X/week      PT Plan Current plan remains appropriate    Co-evaluation  AM-PAC PT "6 Clicks" Mobility   Outcome Measure  Help needed turning from your back to your side while in a flat bed without using bedrails?: A Little Help needed moving from lying on your back to sitting on the side of a flat bed without using bedrails?: A Little Help needed moving to and from a bed to a chair (including a wheelchair)?: A Little Help needed standing up from a chair using your arms (e.g., wheelchair or bedside chair)?: A Little Help needed to walk in hospital room?: A Little Help needed climbing 3-5 steps with a railing? : A Lot 6 Click Score: 17    End of Session         PT Visit Diagnosis: Difficulty in walking, not elsewhere classified (R26.2);Muscle weakness (generalized) (M62.81);Pain Pain - Right/Left: Right Pain - part of body: Knee     Time: 6789-3810 PT Time Calculation  (min) (ACUTE ONLY): 16 min  Charges:  $Gait Training: 8-22 mins                     Baxter Flattery, PT  Acute Rehab Dept ([place]) [phone] Pager [phone]  10/13/2020    [facility] 10/13/2020, 4:21 PM

## 2020-10-13 NOTE — Progress Notes (Signed)
Physical Therapy Treatment Patient Details Name: Jordan [name] MRN: [id number] DOB: [birth year] Today's Date: 10/13/2020    History of Present Illness Pt is 70 yo female s/p R TKA on 10/08/20.  Pt with medical hx including anemia, arthritis, HTN, thyroid CA with thyroid removed, and PONV    PT Comments    Pt is mobilizing well however continues to have episodes of diaphoresis. She became diaphoretic amb to bathroom earlier with NT; amb a long household distance with PT (120') however diaphoretic after seated with BP 105/50.  At this point her medical issues are prolonging her stay. She is mobilizing grossly at a supervision level. Does not have stairs to enter her home and plans to sleep downstairs initially, plan is for  HHPT. Will discuss with RN   Follow Up Recommendations  Follow surgeon's recommendation for DC plan and follow-up therapies;Supervision for mobility/OOB     Equipment Recommendations  Rolling walker with 5" wheels;3in1 (PT)    Recommendations for Other Services       Precautions / Restrictions Precautions Precautions: Fall;Knee Precaution Booklet Issued: No Precaution Comments: instructed no pillow under knee Required Braces or Orthoses: Knee Immobilizer - Right Knee Immobilizer - Right: Discontinue once straight leg raise with < 10 degree lag Restrictions Weight Bearing Restrictions: No RLE Weight Bearing: Weight bearing as tolerated Other Position/Activity Restrictions: WBAT    Mobility  Bed Mobility               General bed mobility comments: in recliner    Transfers   Equipment used: Rolling walker (2 wheeled) Transfers: Sit to/from Stand Sit to Stand: Min guard         General transfer comment: cues for hand placement, RLE position and self assist  Ambulation/Gait Ambulation/Gait assistance: Supervision Gait Distance (Feet): 120 Feet Assistive device: Rolling walker (2 wheeled) Gait Pattern/deviations: Step-to pattern;Decreased  stance time - right     General Gait Details: min/guard to supervision for safety. mild dizziness, c/o feeling "hot" after seated.after above distance. BP 105/50   Stairs             Wheelchair Mobility    Modified Rankin (Stroke Patients Only)       Balance                                            Cognition Arousal/Alertness: Awake/alert Behavior During Therapy: WFL for tasks assessed/performed Overall Cognitive Status: Within Functional Limits for tasks assessed                                        Exercises Total Joint Exercises Ankle Circles/Pumps: AROM;10 reps;Both    General Comments        Pertinent Vitals/Pain Pain Assessment: Faces Faces Pain Scale: Hurts a little bit Pain Location: R knee Pain Descriptors / Indicators: Discomfort;Sore Pain Intervention(s): Limited activity within patient's tolerance;Monitored during session;Premedicated before session;Repositioned;Ice applied    Home Living                      Prior Function            PT Goals (current goals can now be found in the care plan section) Acute Rehab PT Goals Patient Stated Goal: return home PT Goal Formulation: With patient/family Time  For Goal Achievement: 10/22/20 Potential to Achieve Goals: Good Progress towards PT goals: Progressing toward goals    Frequency    7X/week      PT Plan Current plan remains appropriate    Co-evaluation              AM-PAC PT "6 Clicks" Mobility   Outcome Measure  Help needed turning from your back to your side while in a flat bed without using bedrails?: A Little Help needed moving from lying on your back to sitting on the side of a flat bed without using bedrails?: A Little Help needed moving to and from a bed to a chair (including a wheelchair)?: A Little Help needed standing up from a chair using your arms (e.g., wheelchair or bedside chair)?: A Little Help needed to walk in  hospital room?: A Little Help needed climbing 3-5 steps with a railing? : A Lot 6 Click Score: 17    End of Session         PT Visit Diagnosis: Difficulty in walking, not elsewhere classified (R26.2);Muscle weakness (generalized) (M62.81);Pain Pain - Right/Left: Right Pain - part of body: Knee     Time: 1140-1210 PT Time Calculation (min) (ACUTE ONLY): 30 min  Charges:  $Gait Training: 23-37 mins                     Baxter Flattery, PT  Acute Rehab Dept ([place]) [phone] Pager [phone]  10/13/2020    [facility] 10/13/2020, 12:17 PM

## 2020-10-13 NOTE — Progress Notes (Signed)
Orthopedic Tech Progress Note Patient Details:  Jordan [name] 02-Feb-1951 [id number]  [name], female   DOB: 1950-10-19, 70 y.o.   MRN: [id number]   Jordan [name] 10/13/2020, 6:17 PM Pt placed in cpm @1815 

## 2020-10-13 NOTE — Progress Notes (Signed)
Pt continues to be sweaty and weak working with physical therapy though her mobility is greatly improving.BP did drop to 105/56 with mobility and rest afterwards. She does report moderate pain but does not want to take pain medications due to them potentially making her feel worse. Rn called and informed provider on call of issue with pt mobility response and labs ordered.

## 2020-10-13 NOTE — Discharge Summary (Signed)
In most cases prophylactic antibiotics for Dental procdeures after total joint surgery are not necessary.  Exceptions are as follows:  1. History of prior total joint infection  2. Severely immunocompromised (Organ Transplant, cancer chemotherapy, Rheumatoid biologic meds such as [place])  3. Poorly controlled diabetes (A1C &gt; 8.0, blood glucose over 200)  If you have one of these conditions, contact your surgeon for an antibiotic prescription, prior to your dental procedure. Orthopedic Discharge Summary        Physician Discharge Summary  [name] MRN: [id number] DOB/AGE: 1951-05-10 70 y.o.  Admit date: 10/08/2020 Discharge date: 10/13/2020   Procedures:  Procedure(s) (LRB): TOTAL KNEE ARTHROPLASTY (Right)  Attending Physician:  Dr. Hector Shade  Admission Diagnoses:   Right knee OA  Discharge Diagnoses:  same   Past Medical History:  Diagnosis Date  . Anemia   . Arthritis   . Cancer ([place])    thyroid removed  . COVID    08-02-20  . Hypertension   . Hypothyroidism   . PONV (postoperative nausea and vomiting)   . Pre-diabetes    per MD notes on chart pt. denies    PCP: Tobe Sos, MD   Discharged Condition: good  Hospital Course:  Patient underwent the above stated procedure on 10/08/2020. Patient tolerated the procedure well and brought to the recovery room in good condition and subsequently to the floor. Patient had an uncomplicated hospital course and was stable for discharge.   Disposition: Discharge disposition: 01-Home or Self Care      with follow up in 2 weeks    Follow-up Information    Gaynelle Arabian, MD. Schedule an appointment as soon as possible for a visit on 10/23/2020.   Specialty: Orthopedic Surgery Contact information: [address] [place] [address] [phone]               Dental Antibiotics:  In most cases prophylactic antibiotics for Dental procdeures after total  joint surgery are not necessary.  Exceptions are as follows:  1. History of prior total joint infection  2. Severely immunocompromised (Organ Transplant, cancer chemotherapy, Rheumatoid biologic meds such as [place])  3. Poorly controlled diabetes (A1C &gt; 8.0, blood glucose over 200)  If you have one of these conditions, contact your surgeon for an antibiotic prescription, prior to your dental procedure.  Discharge Instructions    Call MD / Call 911   Complete by: As directed    If you experience chest pain or shortness of breath, CALL 911 and be transported to the hospital emergency room.  If you develope a fever above 101 F, pus (white drainage) or increased drainage or redness at the wound, or calf pain, call your surgeon's office.   Call MD / Call 911   Complete by: As directed    If you experience chest pain or shortness of breath, CALL 911 and be transported to the hospital emergency room.  If you develope a fever above 101 F, pus (white drainage) or increased drainage or redness at the wound, or calf pain, call your surgeon's office.   Call MD / Call 911   Complete by: As directed    If you experience chest pain or shortness of breath, CALL 911 and be transported to the hospital emergency room.  If you develope a fever above 101 F, pus (white drainage) or increased drainage or redness at the wound, or calf pain, call your surgeon's office.   Change dressing   Complete  by: As directed    You may remove the bulky bandage (ACE wrap and gauze) two days after surgery. You will have an adhesive waterproof bandage underneath. Leave this in place until your first follow-up appointment.   Change dressing   Complete by: As directed    You may remove the bulky bandage (ACE wrap and gauze) two days after surgery. You will have an adhesive waterproof bandage underneath. Leave this in place until your first follow-up appointment.   Constipation Prevention   Complete by: As directed    Drink  plenty of fluids.  Prune juice may be helpful.  You may use a stool softener, such as Colace (over the counter) 100 mg twice a day.  Use MiraLax (over the counter) for constipation as needed.   Constipation Prevention   Complete by: As directed    Drink plenty of fluids.  Prune juice may be helpful.  You may use a stool softener, such as Colace (over the counter) 100 mg twice a day.  Use MiraLax (over the counter) for constipation as needed.   Constipation Prevention   Complete by: As directed    Drink plenty of fluids.  Prune juice may be helpful.  You may use a stool softener, such as Colace (over the counter) 100 mg twice a day.  Use MiraLax (over the counter) for constipation as needed.   Diet - low sodium heart healthy   Complete by: As directed    Diet - low sodium heart healthy   Complete by: As directed    Diet - low sodium heart healthy   Complete by: As directed    Do not put a pillow under the knee. Place it under the heel.   Complete by: As directed    Do not put a pillow under the knee. Place it under the heel.   Complete by: As directed    Driving restrictions   Complete by: As directed    No driving for two weeks   Driving restrictions   Complete by: As directed    No driving for two weeks   Increase activity slowly as tolerated   Complete by: As directed    TED hose   Complete by: As directed    Use stockings (TED hose) for three weeks on both leg(s).  You may remove them at night for sleeping.   TED hose   Complete by: As directed    Use stockings (TED hose) for three weeks on both leg(s).  You may remove them at night for sleeping.   Weight bearing as tolerated   Complete by: As directed    Weight bearing as tolerated   Complete by: As directed       Allergies as of 10/13/2020      Reactions   Codeine Nausea And Vomiting      Medication List    STOP taking these medications   calcium carbonate 1500 (600 Ca) MG Tabs tablet Commonly known as: OSCAL    cholecalciferol 25 MCG (1000 UNIT) tablet Commonly known as: VITAMIN D3   meloxicam 15 MG tablet Commonly known as: MOBIC     TAKE these medications   HYDROmorphone 2 MG tablet Commonly known as: DILAUDID Take 1-2 tablets (2-4 mg total) by mouth every 6 (six) hours as needed for severe pain.   levothyroxine 150 MCG tablet Commonly known as: SYNTHROID Take 150 mcg by mouth daily before breakfast.   magnesium oxide 400 MG tablet Commonly known as: MAG-OX  Take 800 mg by mouth 2 (two) times daily.   methocarbamol 500 MG tablet Commonly known as: ROBAXIN Take 1 tablet (500 mg total) by mouth every 6 (six) hours as needed for muscle spasms.   rivaroxaban 10 MG Tabs tablet Commonly known as: XARELTO Take 1 tablet (10 mg total) by mouth daily with breakfast for 20 days. Then take one 81 mg aspirin once a day for three weeks. Then discontinue aspirin.   traMADol 50 MG tablet Commonly known as: ULTRAM Take 1-2 tablets (50-100 mg total) by mouth every 6 (six) hours as needed for moderate pain.   valsartan-hydrochlorothiazide 160-25 MG tablet Commonly known as: DIOVAN-HCT Take 1 tablet by mouth daily.            Discharge Care Instructions  (From admission, onward)         Start     Ordered   10/11/20 0000  Weight bearing as tolerated        10/11/20 0726   10/11/20 0000  Change dressing       Comments: You may remove the bulky bandage (ACE wrap and gauze) two days after surgery. You will have an adhesive waterproof bandage underneath. Leave this in place until your first follow-up appointment.   10/11/20 0726   10/09/20 0000  Weight bearing as tolerated        10/09/20 0707   10/09/20 0000  Change dressing       Comments: You may remove the bulky bandage (ACE wrap and gauze) two days after surgery. You will have an adhesive waterproof bandage underneath. Leave this in place until your first follow-up appointment.   10/09/20 2951            Signed: Augustin Schooling 10/13/2020, 6:54 AM  [place] [facility] now [facility] [address], [place], [place], [place] [address] Phone: [place]

## 2020-10-14 NOTE — Progress Notes (Signed)
Physical Therapy Treatment Patient Details Name: Jordan [name] MRN: [id number] DOB: 04-29-1951 Today's Date: 10/14/2020    History of Present Illness Pt is 70 yo female s/p R TKA on 10/08/20.  Pt with medical hx including anemia, arthritis, HTN, thyroid CA with thyroid removed, and PONV    PT Comments    Pt feeling better today. Pt reports she has been up to bathroom multiple times, RN confirms this without dizziness/diaphoresis. Pt fatigued after exercise session and requested to go back to bed. Pt is mod I with bed mobility and transfers.  Pt is ready to d/c with HHPT and family assist prn from PT standpoint.  Follow Up Recommendations  Follow surgeon's recommendation for DC plan and follow-up therapies;Supervision for mobility/OOB     Equipment Recommendations  Rolling walker with 5" wheels;3in1 (PT)    Recommendations for Other Services       Precautions / Restrictions Precautions Precautions: Fall;Knee Precaution Booklet Issued: No Precaution Comments: instructed no pillow under knee Required Braces or Orthoses: Knee Immobilizer - Right Knee Immobilizer - Right: Discontinue once straight leg raise with < 10 degree lag Restrictions Weight Bearing Restrictions: No RLE Weight Bearing: Weight bearing as tolerated Other Position/Activity Restrictions: WBAT    Mobility  Bed Mobility Overal bed mobility: Modified Independent                  Transfers   Equipment used: Rolling walker (2 wheeled) Transfers: Sit to/from [facility] Sit to Stand: Supervision;Modified independent (Device/Increase time) Stand pivot transfers: Modified independent (Device/Increase time)       General transfer comment: no physical assist, incr time  Ambulation/Gait Ambulation/Gait assistance: Supervision;Modified independent (Device/Increase time) Gait Distance (Feet): 6 Feet Assistive device: Rolling walker (2 wheeled) Gait Pattern/deviations: Step-to  pattern;Decreased stance time - right Gait velocity: decr   General Gait Details: steps forward and back; pt fatigued and requested to return to bed after exercises. no physical assist,demo's correct sequencing and hand placement/good carry over from previous sessions   Stairs             Wheelchair Mobility    Modified Rankin (Stroke Patients Only)       Balance     Sitting balance-Leahy Scale: Good       Standing balance-Leahy Scale: Fair Standing balance comment: is able to static stand with wider BOS and no UE support                            Cognition Arousal/Alertness: Awake/alert Behavior During Therapy: WFL for tasks assessed/performed Overall Cognitive Status: Within Functional Limits for tasks assessed                                        Exercises Total Joint Exercises Ankle Circles/Pumps: AROM;10 reps;Both Quad Sets: AROM;Both;15 reps Heel Slides: AAROM;Right;10 reps Hip ABduction/ADduction: AROM;AAROM;Right;10 reps Straight Leg Raises: AAROM;Right;10 reps Goniometric ROM: grossly 6 to 65 degrees AAROm R knee flexion    General Comments        Pertinent Vitals/Pain Pain Assessment: Faces Faces Pain Scale: Hurts a little bit Pain Location: R knee Pain Descriptors / Indicators: Discomfort;Sore Pain Intervention(s): Limited activity within patient's tolerance;Monitored during session;Premedicated before session;Repositioned    Home Living                      Prior  Function            PT Goals (current goals can now be found in the care plan section) Acute Rehab PT Goals Patient Stated Goal: return home PT Goal Formulation: With patient/family Time For Goal Achievement: 10/22/20 Potential to Achieve Goals: Good Progress towards PT goals: Progressing toward goals    Frequency    7X/week      PT Plan Current plan remains appropriate    Co-evaluation              AM-PAC PT "6  Clicks" Mobility   Outcome Measure  Help needed turning from your back to your side while in a flat bed without using bedrails?: None Help needed moving from lying on your back to sitting on the side of a flat bed without using bedrails?: None Help needed moving to and from a bed to a chair (including a wheelchair)?: None Help needed standing up from a chair using your arms (e.g., wheelchair or bedside chair)?: None Help needed to walk in hospital room?: None Help needed climbing 3-5 steps with a railing? : A Little 6 Click Score: 23    End of Session Equipment Utilized During Treatment: Gait belt Activity Tolerance: Patient tolerated treatment well Patient left: in bed;with call bell/phone within reach;with bed alarm set Nurse Communication: Mobility status PT Visit Diagnosis: Difficulty in walking, not elsewhere classified (R26.2);Muscle weakness (generalized) (M62.81);Pain Pain - Right/Left: Right Pain - part of body: Knee     Time: 9169-4503 PT Time Calculation (min) (ACUTE ONLY): 18 min  Charges:  $Therapeutic Exercise: 8-22 mins                     Baxter Flattery, PT  Acute Rehab Dept ([place]) [phone] Pager [phone]  10/14/2020    [facility] 10/14/2020, 12:26 PM

## 2020-10-14 NOTE — Progress Notes (Signed)
[name], female   DOB: May 19, 1951, 70 y.o.   MRN: [id number] Subjective: 6 Days Post-Op Procedure(s) (LRB): TOTAL KNEE ARTHROPLASTY (Right)    Patient reports pain as mild to moderate.  States that she was able to mobilize a bit more with therapy with less orthostatic issues  Objective:   VITALS:   Vitals:   10/13/20 2112 10/14/20 0516  BP: 112/87 131/69  Pulse: 79 79  Resp: 18 17  Temp: 98.9 F (37.2 C) 98.8 F (37.1 C)  SpO2: 100% 97%    Neurovascular intact Incision: dressing C/D/I - right knee  LABS Recent Labs    10/13/20 1322  HGB 10.0*  HCT 30.9*  WBC 8.5  PLT 195    Recent Labs    10/13/20 1322  NA 135  K 4.0  BUN 15  CREATININE 0.90  GLUCOSE 148*    No results for input(s): LABPT, INR in the last 72 hours.   Assessment/Plan: 6 Days Post-Op Procedure(s) (LRB): TOTAL KNEE ARTHROPLASTY (Right)   Up with therapy   She will decide with therapy if her functional ability is safe enough to go home today versus tomorrow Maintain current medication and diet

## 2020-10-14 NOTE — Progress Notes (Signed)
Pt stable at time of d/c. No needs at time of d/c. Rn medicated pt prior to d/c.

## 2020-10-14 NOTE — Progress Notes (Signed)
Pt stable at time of d/c instructions and education. Pt dressing dry and intact. No needs at this time. Pt waiting on ride to come. Rn will continue to monitor.

## 2020-10-14 NOTE — Plan of Care (Signed)
  Problem: Health Behavior/Discharge Planning: Goal: Ability to manage health-related needs will improve Outcome: Progressing   Problem: Pain Managment: Goal: General experience of comfort will improve Outcome: Progressing   

## 2021-06-19 IMAGING — DX DG FOOT COMPLETE 3+V*R*
3 series · 3 of 3 positions shown · non-contrast
Comparison: Plain films of the feet 12/26/2019.

CLINICAL DATA: Status post hammertoe repair of the second toes
bilaterally.

EXAM:
LEFT FOOT - COMPLETE 3+ VIEW; RIGHT FOOT COMPLETE - 3+ VIEW

[foot ap]
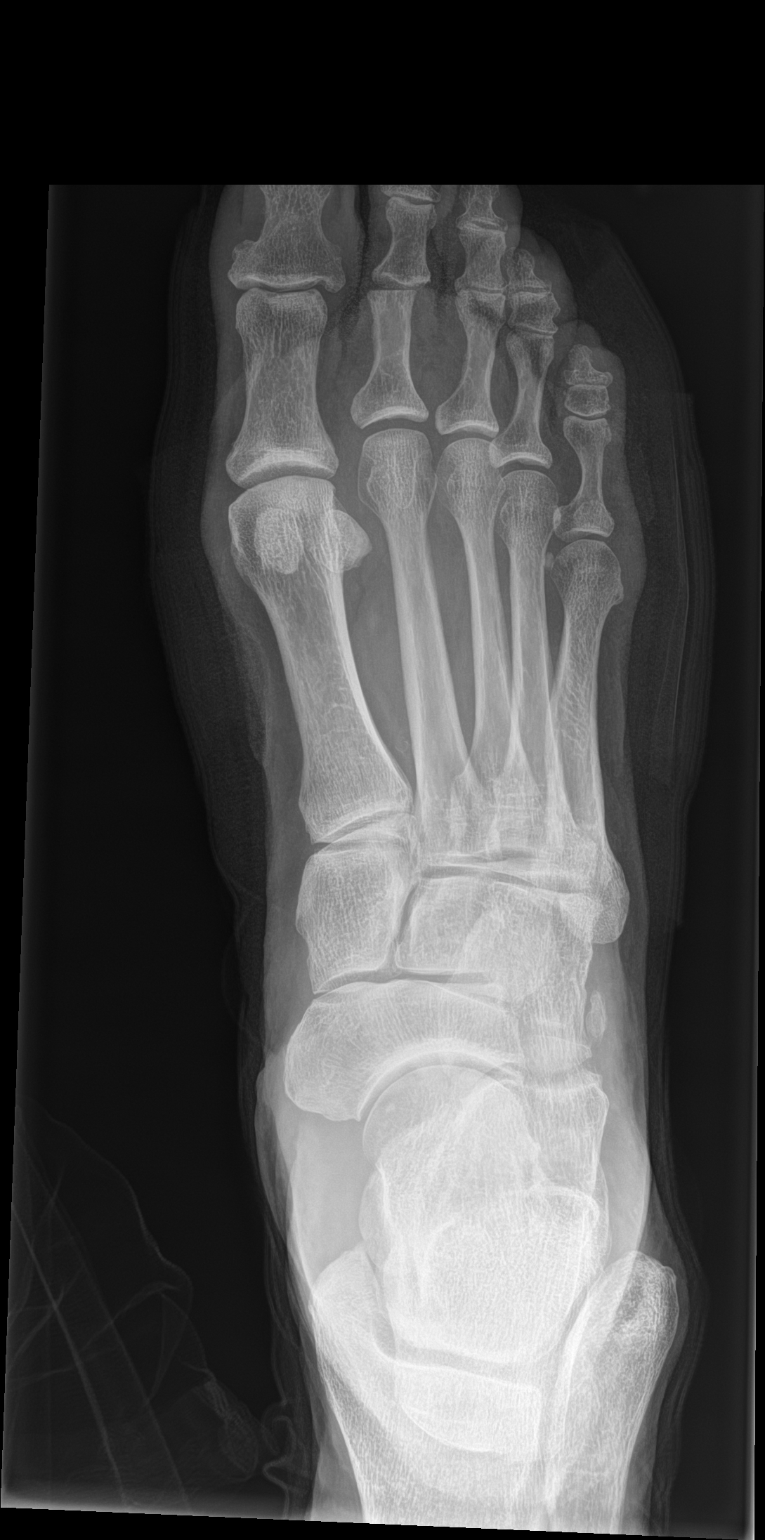

[foot obl]
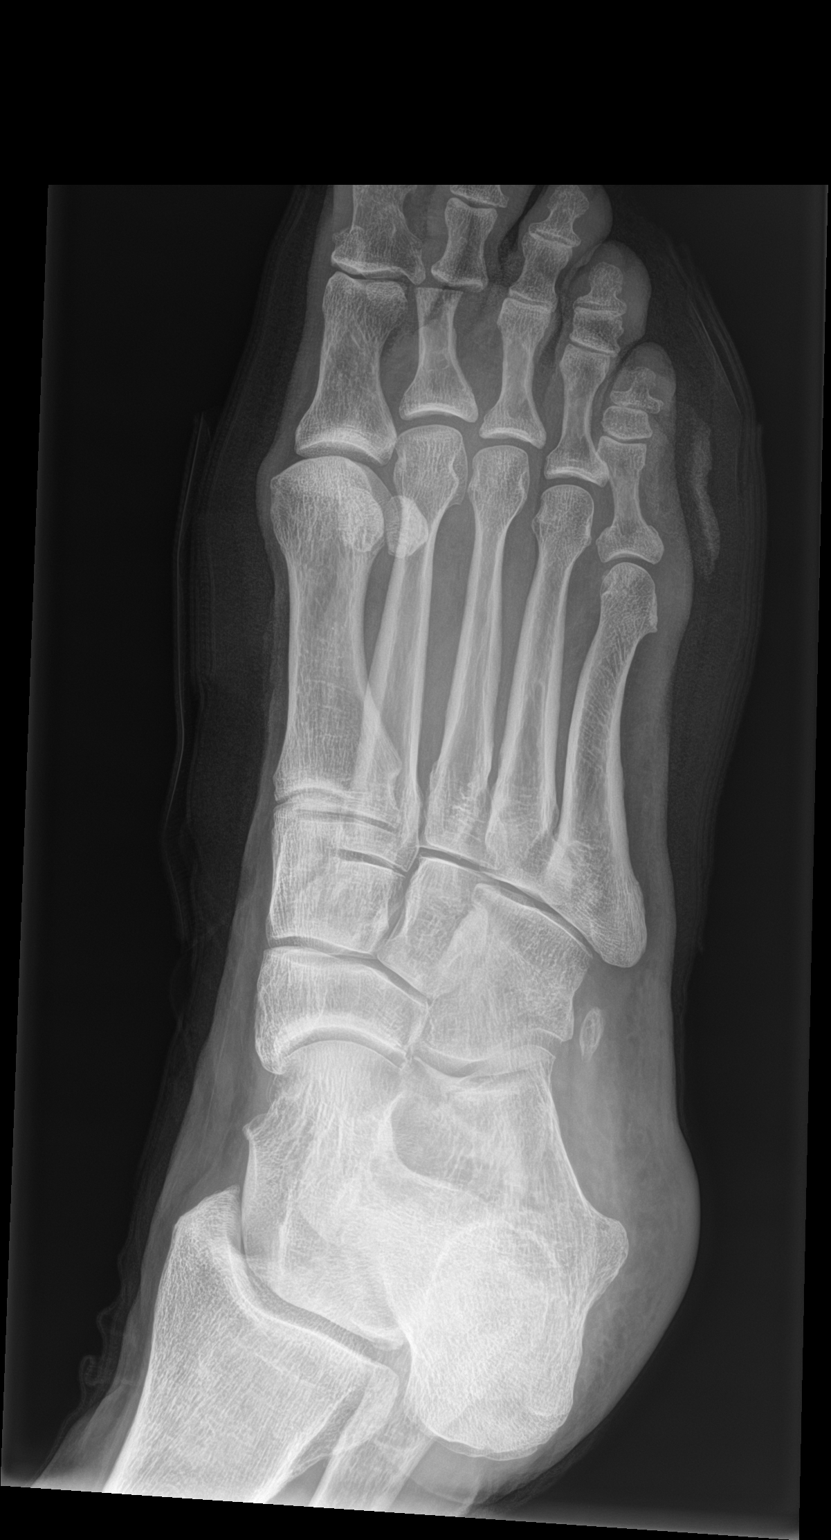

[foot lat]
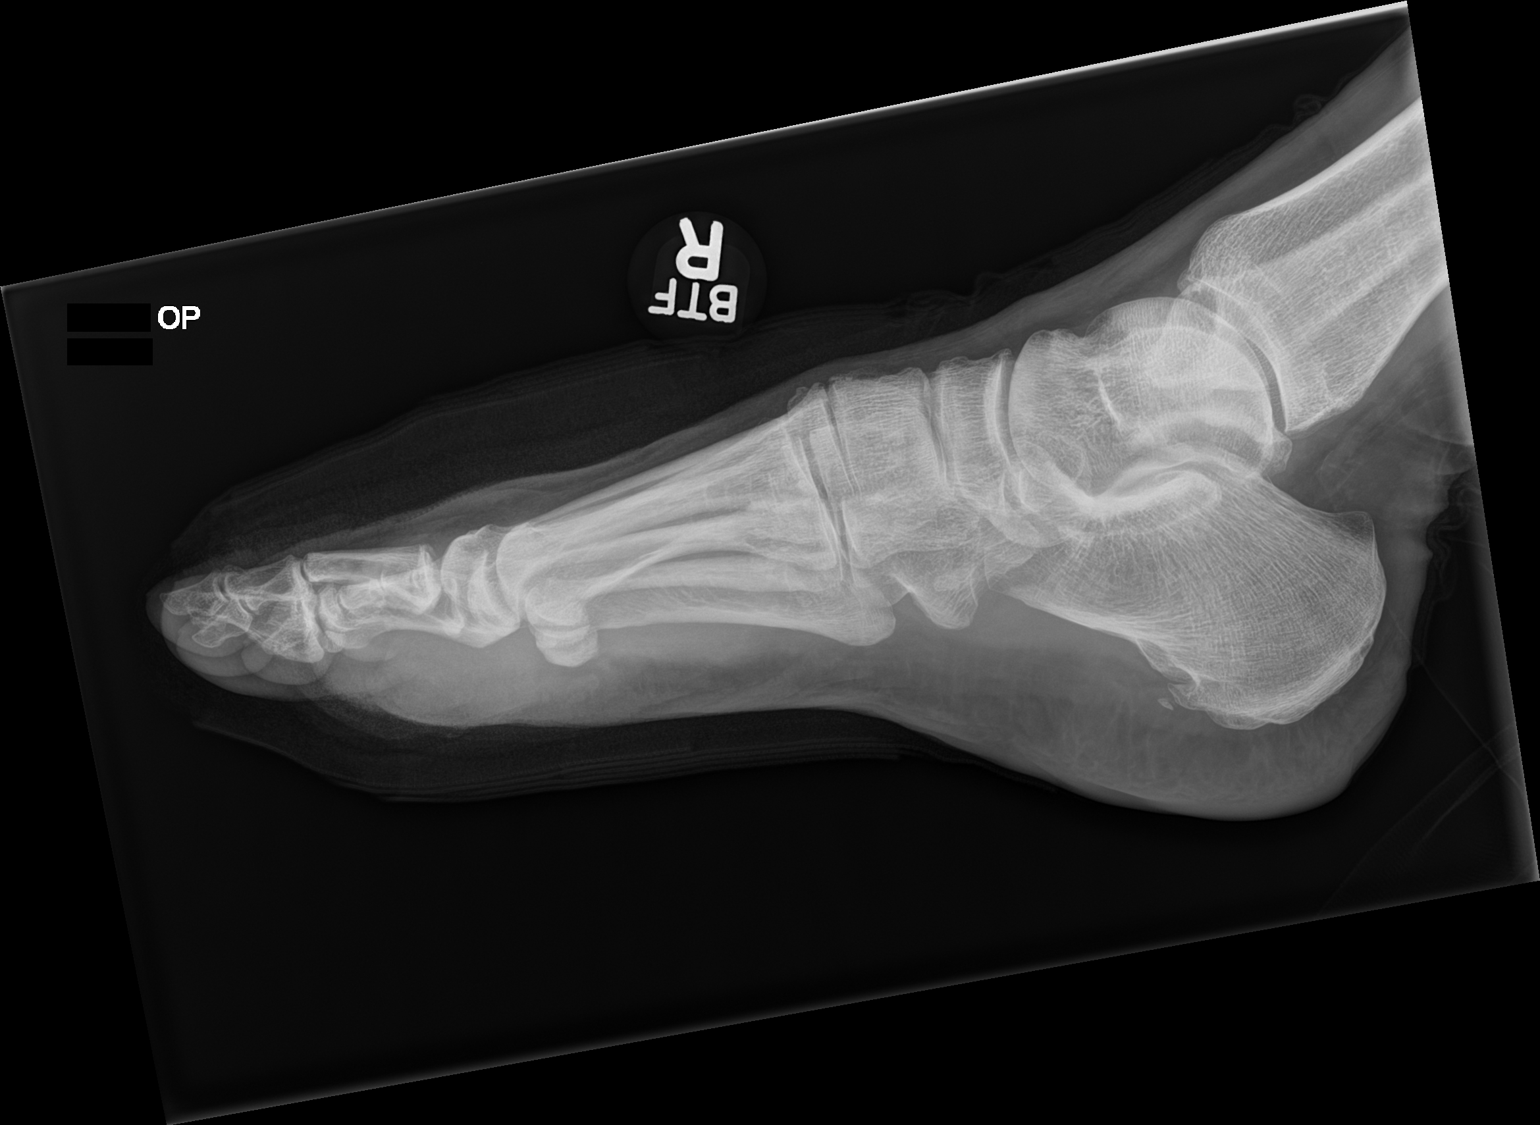

[3 of 3 positions shown; findings below may reference images not displayed]

FINDINGS: The patient has undergone resection arthroplasty of the heads of the
proximal phalanges of the second toes bilaterally. Previously seen
hammertoe deformities have resolved. No acute abnormality is
identified.
IMPRESSION: Status post resection arthroplasty of the heads of the proximal
phalanges of the second toes bilaterally without evidence of
complication. No acute abnormality.

## 2021-06-19 IMAGING — DX DG FOOT COMPLETE 3+V*L*
3 series · 3 of 3 positions shown · non-contrast
Comparison: Plain films of the feet 12/26/2019.

CLINICAL DATA: Status post hammertoe repair of the second toes
bilaterally.

EXAM:
LEFT FOOT - COMPLETE 3+ VIEW; RIGHT FOOT COMPLETE - 3+ VIEW

[foot ap]
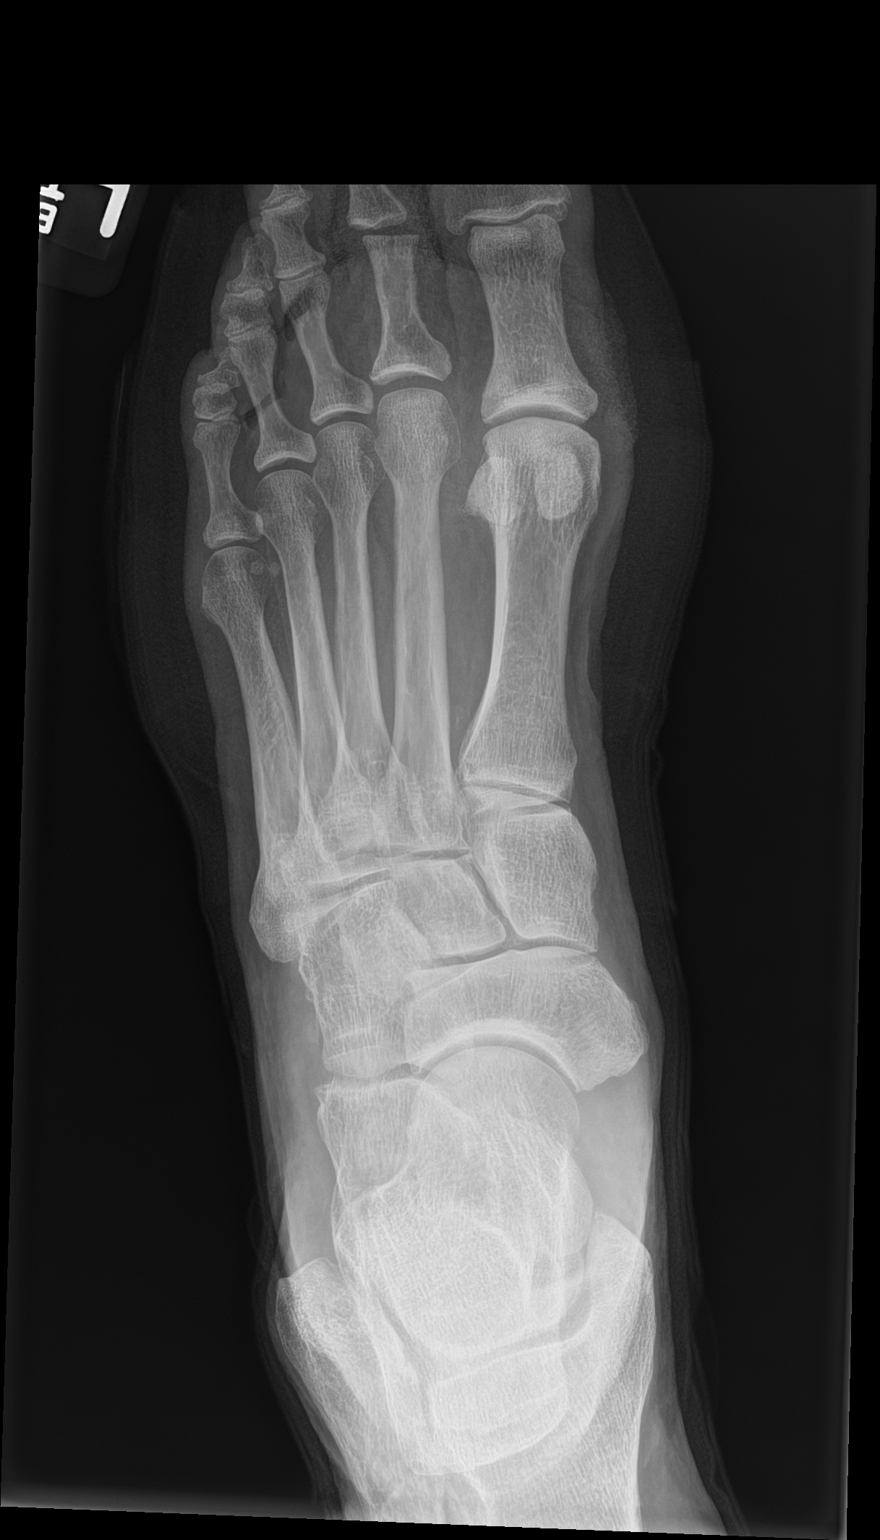

[foot obl]
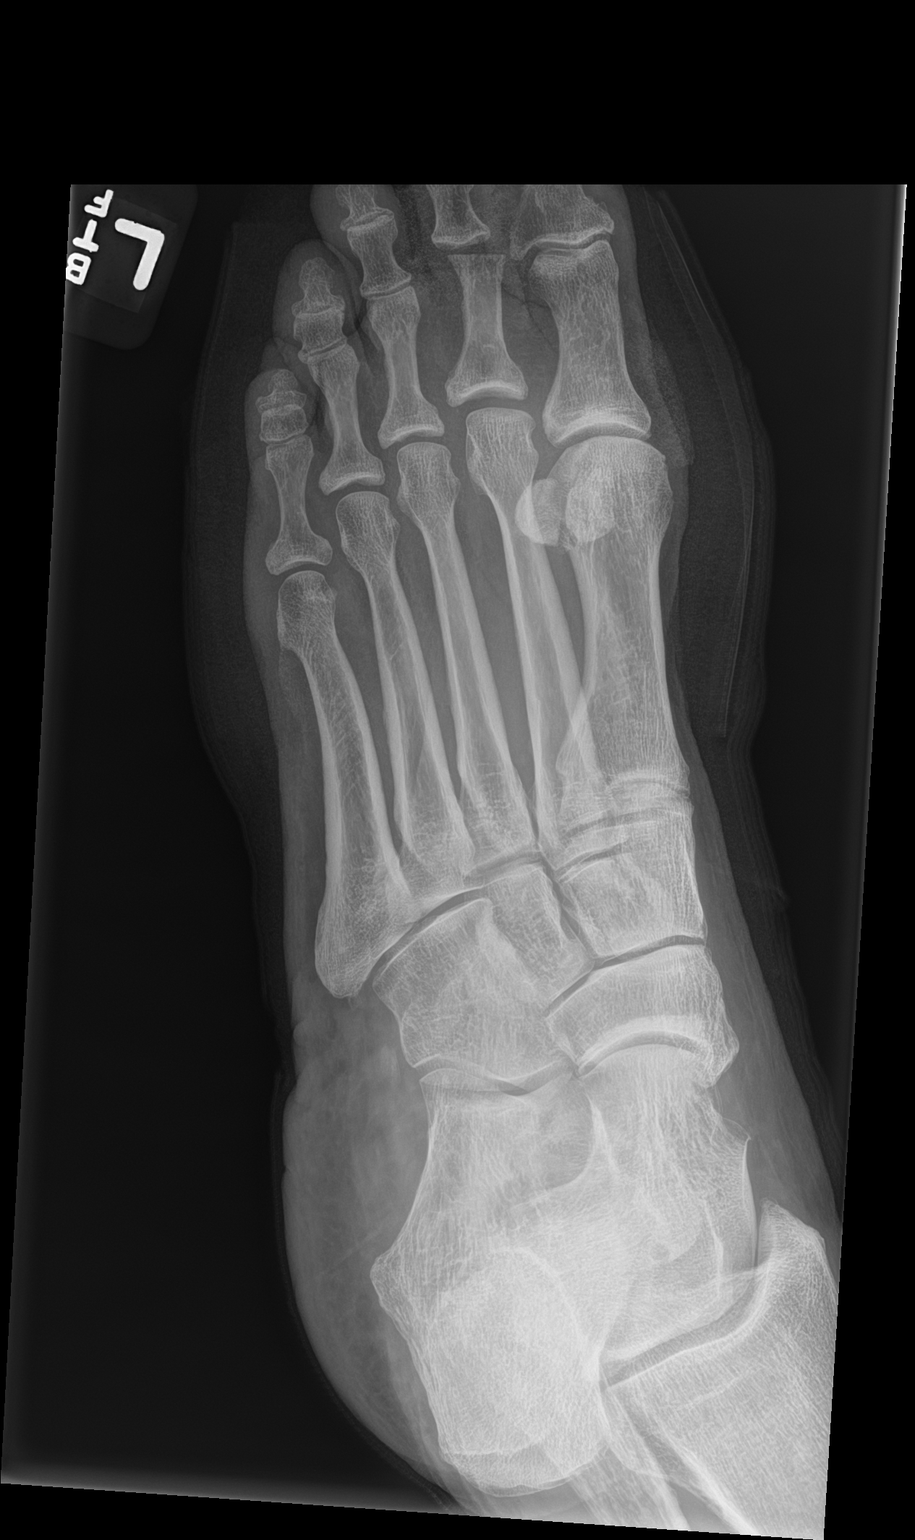

[foot lat]
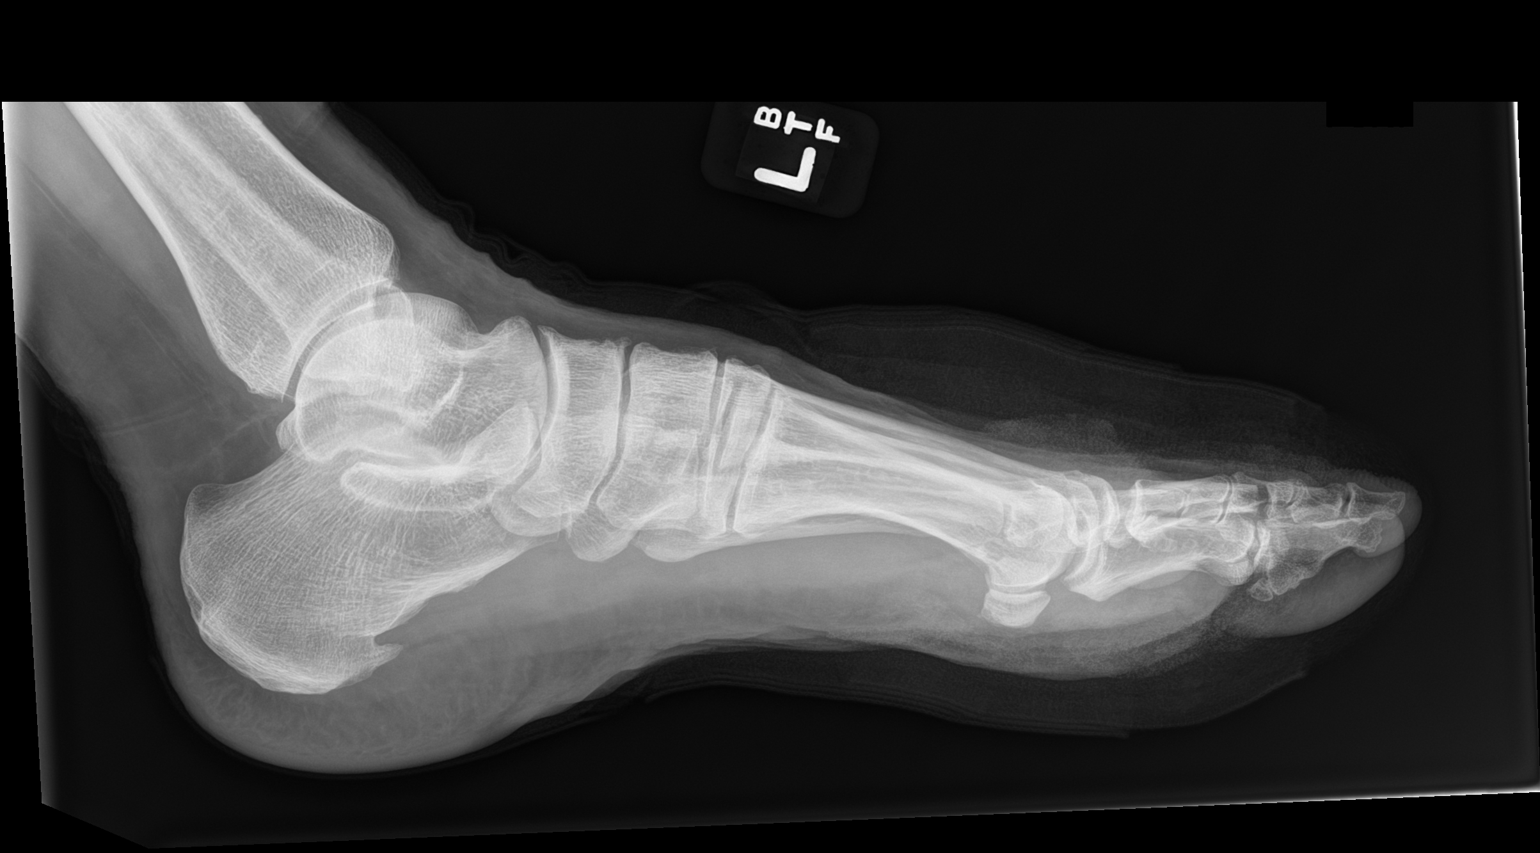

[3 of 3 positions shown; findings below may reference images not displayed]

FINDINGS: The patient has undergone resection arthroplasty of the heads of the
proximal phalanges of the second toes bilaterally. Previously seen
hammertoe deformities have resolved. No acute abnormality is
identified.
IMPRESSION: Status post resection arthroplasty of the heads of the proximal
phalanges of the second toes bilaterally without evidence of
complication. No acute abnormality.

## 2021-11-22 NOTE — H&P (Addendum)
TOTAL KNEE ADMISSION H&P  Patient is being admitted for left total knee arthroplasty.  Subjective:  Chief Complaint: Left knee pain.  HPI: Jordan [name], 71 y.o. female has a history of pain and functional disability in the left knee due to arthritis and has failed non-surgical conservative treatments for greater than 12 weeks to include NSAID's and/or analgesics, corticosteriod injections, and activity modification. Onset of symptoms was gradual, starting several years ago with gradually worsening course since that time. The patient noted no past surgery on the left knee.  Patient currently rates pain in the left knee at 7 out of 10 with activity. Patient has night pain, worsening of pain with activity and weight bearing, and pain with passive range of motion. Patient has evidence of  bone-on-bone arthritis in the medial and patellofemoral compartments of the left knee with varus deformity and tibial subluxation  by imaging studies. There is no active infection.  Patient Active Problem List   Diagnosis Date Noted   OA (osteoarthritis) of knee 10/08/2020   Primary osteoarthritis of right knee 10/08/2020    Past Medical History:  Diagnosis Date   Anemia    Arthritis    Cancer ([place])    thyroid removed   COVID    08-02-20   Hypertension    Hypothyroidism    PONV (postoperative nausea and vomiting)    Pre-diabetes    per MD notes on chart pt. denies    Past Surgical History:  Procedure Laterality Date   CHOLECYSTECTOMY     FLEXOR TENOTOMY  Left 12/28/2019   Procedure: FLEXOR TENOTOMY;  Surgeon: Tyson Babinski, DPM;  Location: [facility];  Service: Podiatry;  Laterality: Left;   REPAIR EXTENSOR TENDON Left 12/28/2019   Procedure: EXTENSOR TENDON LENGTHENING;  Surgeon: Tyson Babinski, DPM;  Location: [facility];  Service: Podiatry;  Laterality: Left;   THYROIDECTOMY     TOE ARTHROPLASTY Bilateral 12/28/2019   Procedure: TOE ARTHROPLASTY RIGHT SECOND TOE AND LEFT SECOND TOE;   Surgeon: Tyson Babinski, DPM;  Location: [facility];  Service: Podiatry;  Laterality: Bilateral;   TOTAL KNEE ARTHROPLASTY Right 10/08/2020   Procedure: TOTAL KNEE ARTHROPLASTY;  Surgeon: Gaynelle Arabian, MD;  Location: [facility];  Service: Orthopedics;  Laterality: Right;  42mn    Prior to Admission medications   Medication Sig Start Date End Date Taking? Authorizing Provider  HYDROmorphone (DILAUDID) 2 MG tablet Take 1-2 tablets (2-4 mg total) by mouth every 6 (six) hours as needed for severe pain. 10/09/20   Edmisten, KOk Anis PA  levothyroxine (SYNTHROID) 150 MCG tablet Take 150 mcg by mouth daily before breakfast.    [provider]  magnesium oxide (MAG-OX) 400 MG tablet Take 800 mg by mouth 2 (two) times daily.    [provider]  methocarbamol (ROBAXIN) 500 MG tablet Take 1 tablet (500 mg total) by mouth every 6 (six) hours as needed for muscle spasms. 10/09/20   Edmisten, KOk Anis PA  rivaroxaban (XARELTO) 10 MG TABS tablet Take 1 tablet (10 mg total) by mouth daily with breakfast for 20 days. Then take one 81 mg aspirin once a day for three weeks. Then discontinue aspirin. 10/09/20 10/29/20  Edmisten, KOk Anis PA  traMADol (ULTRAM) 50 MG tablet Take 1-2 tablets (50-100 mg total) by mouth every 6 (six) hours as needed for moderate pain. 10/09/20   Edmisten, Kristie L, PA  valsartan-hydrochlorothiazide (DIOVAN-HCT) 160-25 MG tablet Take 1 tablet by mouth daily. 11/07/19   [provider]    Allergies  Allergen Reactions   Codeine Nausea And Vomiting    Social History   Socioeconomic History   Marital status: Widowed    Spouse name: Not on file   Number of children: Not on file   Years of education: Not on file   Highest education level: Not on file  Occupational History   Not on file  Tobacco Use   Smoking status: Never   Smokeless tobacco: Never  Vaping Use   Vaping Use: Never used  Substance and Sexual Activity   Alcohol use: Not Currently     Comment:  rare at holiday   Drug use: Never   Sexual activity: Yes  Other Topics Concern   Not on file  Social History Narrative   Not on file   Social Determinants of Health   Financial Resource Strain: Not on file  Food Insecurity: Not on file  Transportation Needs: Not on file  Physical Activity: Not on file  Stress: Not on file  Social Connections: Not on file  Intimate Partner Violence: Not on file    Tobacco Use: Not on file   Social History   Substance and Sexual Activity  Alcohol Use Not Currently   Comment:  rare at holiday    No family history on file.  Review of Systems  Constitutional:  Negative for chills and fever.  HENT: Negative.    Eyes: Negative.   Respiratory:  Negative for cough and shortness of breath.   Cardiovascular:  Negative for chest pain and palpitations.  Gastrointestinal:  Negative for abdominal pain, constipation, diarrhea, nausea and vomiting.  Genitourinary:  Negative for dysuria, frequency and urgency.  Musculoskeletal:  Positive for joint pain.  Skin:  Negative for rash.   Objective:  Physical Exam: Well nourished and well developed.  General: Alert and oriented x3, cooperative and pleasant, no acute distress.  Head: normocephalic, atraumatic, neck supple.  Eyes: EOMI. Abdomen: non-tender to palpation and soft, normoactive bowel sounds. Musculoskeletal: Right Knee Exam:   Looks fantastic.   No effusion present. No swelling present.   The range of motion is: 0 to 125 degrees.   No crepitus on range of motion of the knee.   No medial joint line tenderness.   No lateral joint line tenderness.   The knee is stable.   Left Knee Exam:   No effusion present. No swelling present.   Slight varus deformity.   The range of motion is: 10 to 120 degrees.   Marked crepitus on range of motion of the knee.   Positive medial greater than lateral joint line tenderness.   The knee is stable.  Calves soft and nontender. Motor function  intact in LE. Strength 5/5 LE bilaterally. Neuro: Distal pulses 2+. Sensation to light touch intact in LE.  Vital signs in last 24 hours: BP: ()/()  Arterial Line BP: ()/()   Imaging Review Plain radiographs demonstrate severe degenerative joint disease of the left knee. The overall alignment is mild varus. The bone quality appears to be adequate for age and reported activity level.  Assessment/Plan:  End stage arthritis, left knee   The patient history, physical examination, clinical judgment of the provider and imaging studies are consistent with end stage degenerative joint disease of the left knee and total knee arthroplasty is deemed medically necessary. The treatment options including medical management, injection therapy arthroscopy and arthroplasty were discussed at length. The risks and benefits of total knee arthroplasty were presented and reviewed. The risks due to aseptic loosening,  infection, stiffness, patella tracking problems, thromboembolic complications and other imponderables were discussed. The patient acknowledged the explanation, agreed to proceed with the plan and consent was signed. Patient is being admitted for inpatient treatment for surgery, pain control, PT, OT, prophylactic antibiotics, VTE prophylaxis, progressive ambulation and ADLs and discharge planning. The patient is planning to be discharged home with home health services.  Patient's anticipated LOS is less than 2 midnights, meeting these requirements: - Has a competent adult at home to recover with post-op - NO history of  - Chronic pain requiring opiods  - Diabetes  - Coronary Artery Disease  - Heart failure  - Heart attack  - Stroke  - Cardiac arrhythmia  - Respiratory Failure/COPD  - Renal failure  - Anemia  - Advanced Liver disease  Therapy Plans: HHPT (x2 weeks) followed by OPPT at [facility] in [place] Disposition: Home with Son/Daughter Planned DVT Prophylaxis: Xarelto 10 mg QD or  Eliquis (cost concern) DME Needed: None PCP: Ralph Leyden, MD (clearance received) TXA: Topical (hx of DVT) Allergies: codeine (N/V), gabapentin (hallucinations, N/V) Anesthesia Concerns: Nausea BMI: 39 Last HgbA1c: n/a  [facility] [place]  Other: -Will set up home health while inpatient -Concerned about cost of DVT prophylaxis - no prescription drug insurance; is supposed to be on Xarelto at baseline but does not take due to cost  - Patient was instructed on what medications to stop prior to surgery. - Follow-up visit in 2 weeks with Dr. Wynelle Link - Begin physical therapy following surgery - Pre-operative lab work as pre-surgical testing - Prescriptions will be provided in hospital at time of discharge  R. Jaynie Bream, PA-C Orthopedic Surgery [facility] [place]

## 2021-11-28 NOTE — Progress Notes (Addendum)
COVID Vaccine Completed:  Yes  Date of COVID positive in last 90 days:  No  PCP - Ralph Leyden, MD Cardiologist - N/A  Medical clearance on chart from Dr. Shon Millet dated 10-08-21  Chest x-ray - N/A EKG - 10-08-21 Epic Stress Test - N/A ECHO - N/A Cardiac Cath - N/A Pacemaker/ICD device last checked: Spinal Cord Stimulator:  Bowel Prep - N/A  Sleep Study - N/A CPAP -   Fasting Blood Sugar - N/A Checks Blood Sugar _____ times a day  Blood Thinner Instructions:  N/A Aspirin Instructions: Last Dose:  Activity level:   Can go up a flight of stairs and perform activities of daily living without stopping and without symptoms of chest pain or shortness of breath.    Anesthesia review: N/A  Patient denies shortness of breath, fever, cough and chest pain at PAT appointment  Patient verbalized understanding of instructions that were given to them at the PAT appointment. Patient was also instructed that they will need to review over the PAT instructions again at home before surgery.

## 2021-11-28 NOTE — Patient Instructions (Addendum)
DUE TO COVID-19 ONLY TWO VISITORS  (aged 69 and older)  IS ALLOWED TO COME WITH YOU AND STAY IN THE WAITING ROOM ONLY DURING PRE OP AND PROCEDURE.   **NO VISITORS ARE ALLOWED IN THE SHORT STAY AREA OR RECOVERY ROOM!!**  IF YOU WILL BE ADMITTED INTO THE HOSPITAL YOU ARE ALLOWED ONLY FOUR SUPPORT PEOPLE DURING VISITATION HOURS ONLY (7 AM -8PM)   The support person(s) must pass our screening, gel in and out Visitors GUEST BADGE MUST BE WORN VISIBLY  One adult visitor may remain with you overnight and MUST be in the room by 8 P.M.   You are not required to [employer] often Do NOT share personal items Notify your provider if you are in close contact with someone who has COVID or you develop fever 100.4 or greater, new onset of sneezing, cough, sore throat, shortness of breath or body aches.        Your procedure is scheduled on:  12-16-21   Report to [facility] Main Entrance    Report to admitting at 5:35 AM   Call this number if you have problems the morning of surgery [phone]   Do not eat food :After Midnight.   After Midnight you may have the following liquids until 5:20 AM DAY OF SURGERY  Water Black Coffee (sugar ok, NO MILK/CREAM OR CREAMERS)  Tea (sugar ok, NO MILK/CREAM OR CREAMERS) regular and decaf                             Plain Jell-O (NO RED)                                           Fruit ices (not with fruit pulp, NO RED)                                     Popsicles (NO RED)                                                                  Juice: apple, WHITE grape, WHITE cranberry Sports drinks like Gatorade (NO RED) Clear broth(vegetable,chicken,beef)                   The day of surgery:  Drink ONE (1) Pre-Surgery Clear Ensure at 5:20 AM the morning of surgery. Drink in one sitting. Do not sip.  This drink was given to you during your hospital  pre-op appointment visit. Nothing else to drink after completing the Pre-Surgery Clear  Ensure          If you have questions, please contact your surgeon's office.   FOLLOW ANY ADDITIONAL PRE OP INSTRUCTIONS YOU RECEIVED FROM YOUR SURGEON'S OFFICE!!!     Oral Hygiene is also important to reduce your risk of infection.                                    Remember - BRUSH YOUR  TEETH THE MORNING OF SURGERY WITH YOUR REGULAR TOOTHPASTE   Do NOT smoke after Midnight   Take these medicines the morning of surgery with A SIP OF WATER: Levothyroxine                             You may not have any metal on your body including hair pins, jewelry, and body piercing             Do not wear make-up, lotions, powders, perfumes or deodorant  Do not wear nail polish including gel and S&S, artificial/acrylic nails, or any other type of covering on natural nails including finger and toenails. If you have artificial nails, gel coating, etc. that needs to be removed by a nail salon please have this removed prior to surgery or surgery may need to be canceled/ delayed if the surgeon/ anesthesia feels like they are unable to be safely monitored.   Do not shave  48 hours prior to surgery.    Do not bring valuables to the hospital. [place].   Contacts, dentures or bridgework may not be worn into surgery.   Bring small overnight bag day of surgery.  Please read over the following fact sheets you were given: IF YOU HAVE QUESTIONS ABOUT YOUR PRE-OP INSTRUCTIONS PLEASE CALL [facility] - Preparing for Surgery Before surgery, you can play an important role.  Because skin is not sterile, your skin needs to be as free of germs as possible.  You can reduce the number of germs on your skin by washing with CHG (chlorahexidine gluconate) soap before surgery.  CHG is an antiseptic cleaner which kills germs and bonds with the skin to continue killing germs even after washing. Please DO NOT use if you have an allergy to CHG or antibacterial soaps.   If your skin becomes reddened/irritated stop using the CHG and inform your nurse when you arrive at Short Stay. Do not shave (including legs and underarms) for at least 48 hours prior to the first CHG shower.  You may shave your face/neck.  Please follow these instructions carefully:  1.  Shower with CHG Soap the night before surgery and the  morning of surgery.  2.  If you choose to wash your hair, wash your hair first as usual with your normal  shampoo.  3.  After you shampoo, rinse your hair and body thoroughly to remove the shampoo.                             4.  Use CHG as you would any other liquid soap.  You can apply chg directly to the skin and wash.  Gently with a scrungie or clean washcloth.  5.  Apply the CHG Soap to your body ONLY FROM THE NECK DOWN.   Do   not use on face/ open                           Wound or open sores. Avoid contact with eyes, ears mouth and   genitals (private parts).                       Wash face,  Genitals (private parts) with your normal soap.  6.  Wash thoroughly, paying special attention to the area where your    surgery  will be performed.  7.  Thoroughly rinse your body with warm water from the neck down.  8.  DO NOT shower/wash with your normal soap after using and rinsing off the CHG Soap.                9.  Pat yourself dry with a clean towel.            10.  Wear clean pajamas.            11.  Place clean sheets on your bed the night of your first shower and do not  sleep with pets. Day of Surgery : Do not apply any lotions/deodorants the morning of surgery.  Please wear clean clothes to the hospital/surgery center.  FAILURE TO FOLLOW THESE INSTRUCTIONS MAY RESULT IN THE CANCELLATION OF YOUR SURGERY  PATIENT SIGNATURE_________________________________  NURSE SIGNATURE__________________________________  ________________________________________________________________________     Adam Phenix  An incentive spirometer is  a tool that can help keep your lungs clear and active. This tool measures how well you are filling your lungs with each breath. Taking long deep breaths may help reverse or decrease the chance of developing breathing (pulmonary) problems (especially infection) following: A long period of time when you are unable to move or be active. BEFORE THE PROCEDURE  If the spirometer includes an indicator to show your best effort, your nurse or respiratory therapist will set it to a desired goal. If possible, sit up straight or lean slightly forward. Try not to slouch. Hold the incentive spirometer in an upright position. INSTRUCTIONS FOR USE  Sit on the edge of your bed if possible, or sit up as far as you can in bed or on a chair. Hold the incentive spirometer in an upright position. Breathe out normally. Place the mouthpiece in your mouth and seal your lips tightly around it. Breathe in slowly and as deeply as possible, raising the piston or the ball toward the top of the column. Hold your breath for 3-5 seconds or for as long as possible. Allow the piston or ball to fall to the bottom of the column. Remove the mouthpiece from your mouth and breathe out normally. Rest for a few seconds and repeat Steps 1 through 7 at least 10 times every 1-2 hours when you are awake. Take your time and take a few normal breaths between deep breaths. The spirometer may include an indicator to show your best effort. Use the indicator as a goal to work toward during each repetition. After each set of 10 deep breaths, practice coughing to be sure your lungs are clear. If you have an incision (the cut made at the time of surgery), support your incision when coughing by placing a pillow or rolled up towels firmly against it. Once you are able to get out of bed, walk around indoors and cough well. You may stop using the incentive spirometer when instructed by your caregiver.  RISKS AND COMPLICATIONS Take your time so you do not  get dizzy or light-headed. If you are in pain, you may need to take or ask for pain medication before doing incentive spirometry. It is harder to take a deep breath if you are having pain. AFTER USE Rest and breathe slowly and easily. It can be helpful to keep track of a log of your progress. Your caregiver can provide you with a simple table to help  with this. If you are using the spirometer at home, follow these instructions: [place] IF:  You are having difficultly using the spirometer. You have trouble using the spirometer as often as instructed. Your pain medication is not giving enough relief while using the spirometer. You develop fever of 100.5 F (38.1 C) or higher. SEEK IMMEDIATE MEDICAL CARE IF:  You cough up bloody sputum that had not been present before. You develop fever of 102 F (38.9 C) or greater. You develop worsening pain at or near the incision site. MAKE SURE YOU:  Understand these instructions. Will watch your condition. Will get help right away if you are not doing well or get worse. Document Released: 11/10/2006 Document Revised: 09/22/2011 Document Reviewed: 01/11/2007 ExitCare Patient Information 2014 ExitCare, [place].   ________________________________________________________________________  WHAT IS A BLOOD TRANSFUSION? Blood Transfusion Information  A transfusion is the replacement of blood or some of its parts. Blood is made up of multiple cells which provide different functions. Red blood cells carry oxygen and are used for blood loss replacement. White blood cells fight against infection. Platelets control bleeding. Plasma helps clot blood. Other blood products are available for specialized needs, such as hemophilia or other clotting disorders. BEFORE THE TRANSFUSION  Who gives blood for transfusions?  Healthy volunteers who are fully evaluated to make sure their blood is safe. This is blood bank blood. Transfusion therapy is the safest it  has ever been in the practice of medicine. Before blood is taken from a donor, a complete history is taken to make sure that person has no history of diseases nor engages in risky social behavior (examples are intravenous drug use or sexual activity with multiple partners). The donor's travel history is screened to minimize risk of transmitting infections, such as malaria. The donated blood is tested for signs of infectious diseases, such as HIV and hepatitis. The blood is then tested to be sure it is compatible with you in order to minimize the chance of a transfusion reaction. If you or a relative donates blood, this is often done in anticipation of surgery and is not appropriate for emergency situations. It takes many days to process the donated blood. RISKS AND COMPLICATIONS Although transfusion therapy is very safe and saves many lives, the main dangers of transfusion include:  Getting an infectious disease. Developing a transfusion reaction. This is an allergic reaction to something in the blood you were given. Every precaution is taken to prevent this. The decision to have a blood transfusion has been considered carefully by your caregiver before blood is given. Blood is not given unless the benefits outweigh the risks. AFTER THE TRANSFUSION Right after receiving a blood transfusion, you will usually feel much better and more energetic. This is especially true if your red blood cells have gotten low (anemic). The transfusion raises the level of the red blood cells which carry oxygen, and this usually causes an energy increase. The nurse administering the transfusion will monitor you carefully for complications. HOME CARE INSTRUCTIONS  No special instructions are needed after a transfusion. You may find your energy is better. Speak with your caregiver about any limitations on activity for underlying diseases you may have. SEEK MEDICAL CARE IF:  Your condition is not improving after your  transfusion. You develop redness or irritation at the intravenous (IV) site. SEEK IMMEDIATE MEDICAL CARE IF:  Any of the following symptoms occur over the next 12 hours: Shaking chills. You have a temperature by mouth above 102 F (  38.9 C), not controlled by medicine. Chest, back, or muscle pain. People around you feel you are not acting correctly or are confused. Shortness of breath or difficulty breathing. Dizziness and fainting. You get a rash or develop hives. You have a decrease in urine output. Your urine turns a dark color or changes to pink, red, or brown. Any of the following symptoms occur over the next 10 days: You have a temperature by mouth above 102 F (38.9 C), not controlled by medicine. Shortness of breath. Weakness after normal activity. The white part of the eye turns yellow (jaundice). You have a decrease in the amount of urine or are urinating less often. Your urine turns a dark color or changes to pink, red, or brown. Document Released: 06/27/2000 Document Revised: 09/22/2011 Document Reviewed: 02/14/2008 [facility] Patient Information 2014 [place], [place].  _______________________________________________________________________

## 2021-12-03 ENCOUNTER — Encounter (HOSPITAL_COMMUNITY): Payer: Self-pay

## 2021-12-03 ENCOUNTER — Encounter (HOSPITAL_COMMUNITY)
Admission: RE | Admit: 2021-12-03 | Discharge: 2021-12-03 | Disposition: A | Discharge status: Home / Self Care | Payer: Medicare Other | Source: Ambulatory Visit | Attending: Orthopedic Surgery | Admitting: Orthopedic Surgery

## 2021-12-03 ENCOUNTER — Other Ambulatory Visit: Payer: Self-pay

## 2021-12-03 VITALS — BP 133/90 | HR 70 | Temp 98.2°F | Resp 18 | Ht 70.0 in | Wt 266.0 lb

## 2021-12-03 DIAGNOSIS — Z01812 Encounter for preprocedural laboratory examination: Secondary | ICD-10-CM | POA: Insufficient documentation

## 2021-12-03 DIAGNOSIS — Z01818 Encounter for other preprocedural examination: Secondary | ICD-10-CM

## 2021-12-03 DIAGNOSIS — M1712 Unilateral primary osteoarthritis, left knee: Secondary | ICD-10-CM | POA: Diagnosis not present

## 2021-12-03 HISTORY — DX: Acute embolism and thrombosis of unspecified deep veins of unspecified lower extremity: I82.409

## 2021-12-03 LAB — COMPREHENSIVE METABOLIC PANEL
ALT: 14 U/L (ref 0–44)
AST: 17 U/L (ref 15–41)
Albumin: 3.8 g/dL (ref 3.5–5.0)
Alkaline Phosphatase: 63 U/L (ref 38–126)
Anion gap: 8 (ref 5–15)
BUN: 19 mg/dL (ref 8–23)
CO2: 27 mmol/L (ref 22–32)
Calcium: 8.8 mg/dL — ABNORMAL LOW (ref 8.9–10.3)
Chloride: 103 mmol/L (ref 98–111)
Creatinine, Ser: 0.93 mg/dL (ref 0.44–1.00)
GFR, Estimated: >60 mL/min (ref >60)
Glucose, Bld: 93 mg/dL (ref 70–99)
Potassium: 3.8 mmol/L (ref 3.5–5.1)
Sodium: 138 mmol/L (ref 135–145)
Total Bilirubin: 0.5 mg/dL (ref 0.3–1.2)
Total Protein: 7.2 g/dL (ref 6.5–8.1)

## 2021-12-03 LAB — SURGICAL PCR SCREEN
MRSA, PCR: NEGATIVE
Staphylococcus aureus: NEGATIVE

## 2021-12-03 LAB — CBC
HCT: 33.8 % — ABNORMAL LOW (ref 36.0–46.0)
Hemoglobin: 11.1 g/dL — ABNORMAL LOW (ref 12.0–15.0)
MCH: 27.8 pg (ref 26.0–34.0)
MCHC: 32.8 g/dL (ref 30.0–36.0)
MCV: 84.7 fL (ref 80.0–100.0)
Platelets: 181 10*3/uL (ref 150–400)
RBC: 3.99 MIL/uL (ref 3.87–5.11)
RDW: 13.7 % (ref 11.5–15.5)
WBC: 5.7 10*3/uL (ref 4.0–10.5)
nRBC: 0 % (ref 0.0–0.2)

## 2021-12-03 LAB — TYPE AND SCREEN
ABO/RH(D): A POS
Antibody Screen: NEGATIVE

## 2021-12-16 ENCOUNTER — Ambulatory Visit (HOSPITAL_COMMUNITY): Payer: Medicare Other | Admitting: Anesthesiology

## 2021-12-16 ENCOUNTER — Encounter (HOSPITAL_COMMUNITY): Payer: Self-pay | Admitting: Orthopedic Surgery

## 2021-12-16 ENCOUNTER — Other Ambulatory Visit: Payer: Self-pay

## 2021-12-16 ENCOUNTER — Ambulatory Visit (HOSPITAL_BASED_OUTPATIENT_CLINIC_OR_DEPARTMENT_OTHER): Payer: Medicare Other | Admitting: Anesthesiology

## 2021-12-16 ENCOUNTER — Encounter (HOSPITAL_COMMUNITY)
Admission: RE | Disposition: A | Discharge status: Home Health | Payer: Self-pay | Source: Ambulatory Visit | Attending: Orthopedic Surgery

## 2021-12-16 ENCOUNTER — Inpatient Hospital Stay (HOSPITAL_COMMUNITY)
Admission: RE | Admit: 2021-12-16 | Discharge: 2021-12-20 | DRG: 470 | Disposition: A | Discharge status: Home Health | Payer: Medicare Other | Source: Ambulatory Visit | Attending: Orthopedic Surgery | Admitting: Orthopedic Surgery

## 2021-12-16 DIAGNOSIS — I10 Essential (primary) hypertension: Secondary | ICD-10-CM

## 2021-12-16 DIAGNOSIS — E039 Hypothyroidism, unspecified: Secondary | ICD-10-CM

## 2021-12-16 DIAGNOSIS — D62 Acute posthemorrhagic anemia: Secondary | ICD-10-CM | POA: Diagnosis not present

## 2021-12-16 DIAGNOSIS — Z6838 Body mass index (BMI) 38.0-38.9, adult: Secondary | ICD-10-CM

## 2021-12-16 DIAGNOSIS — R55 Syncope and collapse: Secondary | ICD-10-CM | POA: Diagnosis not present

## 2021-12-16 DIAGNOSIS — Z86718 Personal history of other venous thrombosis and embolism: Secondary | ICD-10-CM

## 2021-12-16 DIAGNOSIS — Z8616 Personal history of COVID-19: Secondary | ICD-10-CM

## 2021-12-16 DIAGNOSIS — Z7982 Long term (current) use of aspirin: Secondary | ICD-10-CM

## 2021-12-16 DIAGNOSIS — Z885 Allergy status to narcotic agent status: Secondary | ICD-10-CM

## 2021-12-16 DIAGNOSIS — M1712 Unilateral primary osteoarthritis, left knee: Principal | ICD-10-CM | POA: Diagnosis present

## 2021-12-16 DIAGNOSIS — Z888 Allergy status to other drugs, medicaments and biological substances status: Secondary | ICD-10-CM

## 2021-12-16 DIAGNOSIS — Z96651 Presence of right artificial knee joint: Secondary | ICD-10-CM | POA: Diagnosis present

## 2021-12-16 DIAGNOSIS — E669 Obesity, unspecified: Secondary | ICD-10-CM | POA: Diagnosis present

## 2021-12-16 DIAGNOSIS — Z85850 Personal history of malignant neoplasm of thyroid: Secondary | ICD-10-CM

## 2021-12-16 DIAGNOSIS — Z79899 Other long term (current) drug therapy: Secondary | ICD-10-CM

## 2021-12-16 DIAGNOSIS — M1711 Unilateral primary osteoarthritis, right knee: Principal | ICD-10-CM

## 2021-12-16 DIAGNOSIS — M179 Osteoarthritis of knee, unspecified: Secondary | ICD-10-CM | POA: Diagnosis present

## 2021-12-16 DIAGNOSIS — Z79890 Hormone replacement therapy (postmenopausal): Secondary | ICD-10-CM

## 2021-12-16 DIAGNOSIS — E890 Postprocedural hypothyroidism: Secondary | ICD-10-CM | POA: Diagnosis present

## 2021-12-16 DIAGNOSIS — E876 Hypokalemia: Secondary | ICD-10-CM | POA: Diagnosis not present

## 2021-12-16 DIAGNOSIS — I480 Paroxysmal atrial fibrillation: Secondary | ICD-10-CM | POA: Diagnosis not present

## 2021-12-16 DIAGNOSIS — I4891 Unspecified atrial fibrillation: Secondary | ICD-10-CM

## 2021-12-16 HISTORY — PX: TOTAL KNEE ARTHROPLASTY: SHX125

## 2021-12-16 SURGERY — ARTHROPLASTY, KNEE, TOTAL
Anesthesia: Spinal | Site: Knee | Laterality: Left

## 2021-12-16 MED ORDER — METOCLOPRAMIDE HCL 5 MG PO TABS: 5.0000 mg | ORAL_TABLET | Freq: Three times a day (TID) | ORAL | Status: DC | PRN

## 2021-12-16 MED ORDER — LIDOCAINE HCL (PF) 2 % IJ SOLN
INTRAMUSCULAR | Status: AC
  Filled 2021-12-16: qty 5

## 2021-12-16 MED ORDER — OXYCODONE HCL 5 MG PO TABS: 5.0000 mg | ORAL_TABLET | Freq: Once | ORAL | Status: DC | PRN

## 2021-12-16 MED ORDER — FENTANYL CITRATE (PF) 100 MCG/2ML IJ SOLN
INTRAMUSCULAR | Status: DC | PRN
  Administered 2021-12-16 (×2): 50 ug via INTRAVENOUS

## 2021-12-16 MED ORDER — ONDANSETRON HCL 4 MG/2ML IJ SOLN
INTRAMUSCULAR | Status: DC | PRN
  Administered 2021-12-16: 4 mg via INTRAVENOUS

## 2021-12-16 MED ORDER — ACETAMINOPHEN 10 MG/ML IV SOLN
1000.0000 mg | Freq: Four times a day (QID) | INTRAVENOUS | Status: DC
  Administered 2021-12-16: 1000 mg via INTRAVENOUS
  Filled 2021-12-16: qty 100

## 2021-12-16 MED ORDER — KETAMINE HCL 10 MG/ML IJ SOLN
INTRAMUSCULAR | Status: AC
Start: 2021-12-16 — End: ?
  Filled 2021-12-16: qty 1

## 2021-12-16 MED ORDER — METHOCARBAMOL 500 MG PO TABS
500.0000 mg | ORAL_TABLET | Freq: Four times a day (QID) | ORAL | Status: DC | PRN
  Administered 2021-12-16: 500 mg via ORAL
  Filled 2021-12-16: qty 1

## 2021-12-16 MED ORDER — LACTATED RINGERS IV SOLN: INTRAVENOUS | Status: DC

## 2021-12-16 MED ORDER — MENTHOL 3 MG MT LOZG: 1.0000 | LOZENGE | OROMUCOSAL | Status: DC | PRN

## 2021-12-16 MED ORDER — PHENOL 1.4 % MT LIQD: 1.0000 | OROMUCOSAL | Status: DC | PRN

## 2021-12-16 MED ORDER — KETAMINE HCL 10 MG/ML IJ SOLN
INTRAMUSCULAR | Status: DC | PRN
  Administered 2021-12-16: 50 mg via INTRAVENOUS

## 2021-12-16 MED ORDER — VALSARTAN-HYDROCHLOROTHIAZIDE 160-25 MG PO TABS: 1.0000 | ORAL_TABLET | Freq: Every day | ORAL | Status: DC

## 2021-12-16 MED ORDER — BUPIVACAINE LIPOSOME 1.3 % IJ SUSP
INTRAMUSCULAR | Status: AC
  Filled 2021-12-16: qty 20

## 2021-12-16 MED ORDER — PROPOFOL 10 MG/ML IV BOLUS
INTRAVENOUS | Status: AC
  Filled 2021-12-16: qty 20

## 2021-12-16 MED ORDER — MIDAZOLAM HCL 2 MG/2ML IJ SOLN
1.0000 mg | INTRAMUSCULAR | Status: DC
  Administered 2021-12-16: 1 mg via INTRAVENOUS
  Filled 2021-12-16: qty 2

## 2021-12-16 MED ORDER — DEXAMETHASONE SODIUM PHOSPHATE 10 MG/ML IJ SOLN
INTRAMUSCULAR | Status: AC
  Filled 2021-12-16: qty 1

## 2021-12-16 MED ORDER — HYDROMORPHONE HCL 1 MG/ML IJ SOLN
INTRAMUSCULAR | Status: AC
  Filled 2021-12-16: qty 1

## 2021-12-16 MED ORDER — BISACODYL 10 MG RE SUPP: 10.0000 mg | Freq: Every day | RECTAL | Status: DC | PRN

## 2021-12-16 MED ORDER — LORATADINE 10 MG PO TABS
10.0000 mg | ORAL_TABLET | Freq: Every day | ORAL | Status: DC
  Administered 2021-12-17 – 2021-12-20 (×4): 10 mg via ORAL
  Filled 2021-12-16 (×4): qty 1

## 2021-12-16 MED ORDER — TRAMADOL HCL 50 MG PO TABS
50.0000 mg | ORAL_TABLET | Freq: Four times a day (QID) | ORAL | Status: DC | PRN
  Administered 2021-12-18 – 2021-12-19 (×2): 100 mg via ORAL
  Filled 2021-12-16 (×2): qty 2

## 2021-12-16 MED ORDER — ROPIVACAINE HCL 5 MG/ML IJ SOLN
INTRAMUSCULAR | Status: DC | PRN
  Administered 2021-12-16: 20 mL via PERINEURAL

## 2021-12-16 MED ORDER — HYDROCHLOROTHIAZIDE 25 MG PO TABS
25.0000 mg | ORAL_TABLET | Freq: Every day | ORAL | Status: DC
  Administered 2021-12-17 – 2021-12-18 (×2): 25 mg via ORAL
  Filled 2021-12-16 (×2): qty 1

## 2021-12-16 MED ORDER — DOCUSATE SODIUM 100 MG PO CAPS
100.0000 mg | ORAL_CAPSULE | Freq: Two times a day (BID) | ORAL | Status: DC
  Administered 2021-12-16 – 2021-12-20 (×9): 100 mg via ORAL
  Filled 2021-12-16 (×9): qty 1

## 2021-12-16 MED ORDER — PROMETHAZINE HCL 25 MG/ML IJ SOLN
12.5000 mg | Freq: Four times a day (QID) | INTRAMUSCULAR | Status: DC | PRN
  Administered 2021-12-16: 12.5 mg via INTRAVENOUS
  Filled 2021-12-16: qty 12.5

## 2021-12-16 MED ORDER — ONDANSETRON HCL 4 MG/2ML IJ SOLN
4.0000 mg | Freq: Four times a day (QID) | INTRAMUSCULAR | Status: DC | PRN
  Administered 2021-12-18: 4 mg via INTRAVENOUS
  Filled 2021-12-16 (×2): qty 2

## 2021-12-16 MED ORDER — SODIUM CHLORIDE (PF) 0.9 % IJ SOLN
INTRAMUSCULAR | Status: AC
  Filled 2021-12-16: qty 10

## 2021-12-16 MED ORDER — PROPOFOL 10 MG/ML IV BOLUS
INTRAVENOUS | Status: DC | PRN
  Administered 2021-12-16 (×2): 20 mg via INTRAVENOUS

## 2021-12-16 MED ORDER — MORPHINE SULFATE (PF) 2 MG/ML IV SOLN: 1.0000 mg | INTRAVENOUS | Status: DC | PRN

## 2021-12-16 MED ORDER — HYDROMORPHONE HCL 1 MG/ML IJ SOLN
0.2500 mg | INTRAMUSCULAR | Status: DC | PRN
  Administered 2021-12-16 (×4): 0.5 mg via INTRAVENOUS

## 2021-12-16 MED ORDER — HYDROMORPHONE HCL 2 MG PO TABS
2.0000 mg | ORAL_TABLET | ORAL | Status: DC | PRN
  Administered 2021-12-16: 2 mg via ORAL
  Administered 2021-12-16 – 2021-12-18 (×7): 4 mg via ORAL
  Administered 2021-12-19 (×2): 2 mg via ORAL
  Administered 2021-12-20: 4 mg via ORAL
  Filled 2021-12-16 (×4): qty 2
  Filled 2021-12-16: qty 1
  Filled 2021-12-16: qty 2
  Filled 2021-12-16: qty 1
  Filled 2021-12-16 (×3): qty 2
  Filled 2021-12-16: qty 1

## 2021-12-16 MED ORDER — METHOCARBAMOL 500 MG IVPB - SIMPLE MED
500.0000 mg | Freq: Four times a day (QID) | INTRAVENOUS | Status: DC | PRN
  Administered 2021-12-16: 500 mg via INTRAVENOUS

## 2021-12-16 MED ORDER — SODIUM CHLORIDE 0.9 % IV SOLN: INTRAVENOUS | Status: DC

## 2021-12-16 MED ORDER — METHOCARBAMOL 500 MG IVPB - SIMPLE MED
INTRAVENOUS | Status: AC
  Filled 2021-12-16: qty 50

## 2021-12-16 MED ORDER — OXYCODONE HCL 5 MG/5ML PO SOLN: 5.0000 mg | Freq: Once | ORAL | Status: DC | PRN

## 2021-12-16 MED ORDER — ORAL CARE MOUTH RINSE: 15.0000 mL | Freq: Once | OROMUCOSAL | Status: AC

## 2021-12-16 MED ORDER — LEVOTHYROXINE SODIUM 75 MCG PO TABS
150.0000 ug | ORAL_TABLET | Freq: Every day | ORAL | Status: DC
  Administered 2021-12-17 – 2021-12-20 (×4): 150 ug via ORAL
  Filled 2021-12-16 (×4): qty 2

## 2021-12-16 MED ORDER — CHLORHEXIDINE GLUCONATE 0.12 % MT SOLN
15.0000 mL | Freq: Once | OROMUCOSAL | Status: AC
  Administered 2021-12-16: 15 mL via OROMUCOSAL

## 2021-12-16 MED ORDER — POVIDONE-IODINE 10 % EX SWAB
2.0000 "application " | Freq: Once | CUTANEOUS | Status: AC
  Administered 2021-12-16: 2 via TOPICAL

## 2021-12-16 MED ORDER — ONDANSETRON HCL 4 MG PO TABS: 4.0000 mg | ORAL_TABLET | Freq: Four times a day (QID) | ORAL | Status: DC | PRN

## 2021-12-16 MED ORDER — LIDOCAINE 20MG/ML (2%) 15 ML SYRINGE OPTIME
INTRAMUSCULAR | Status: DC | PRN
  Administered 2021-12-16: 60 mg via INTRAVENOUS

## 2021-12-16 MED ORDER — RIVAROXABAN 10 MG PO TABS
10.0000 mg | ORAL_TABLET | Freq: Every day | ORAL | Status: DC
  Administered 2021-12-17 – 2021-12-18 (×2): 10 mg via ORAL
  Filled 2021-12-16 (×2): qty 1

## 2021-12-16 MED ORDER — METOCLOPRAMIDE HCL 5 MG/ML IJ SOLN
5.0000 mg | Freq: Three times a day (TID) | INTRAMUSCULAR | Status: DC | PRN
  Administered 2021-12-16: 10 mg via INTRAVENOUS
  Filled 2021-12-16: qty 2

## 2021-12-16 MED ORDER — ONDANSETRON HCL 4 MG/2ML IJ SOLN
INTRAMUSCULAR | Status: AC
  Filled 2021-12-16: qty 2

## 2021-12-16 MED ORDER — ACETAMINOPHEN 500 MG PO TABS
1000.0000 mg | ORAL_TABLET | Freq: Four times a day (QID) | ORAL | Status: AC
  Administered 2021-12-16 – 2021-12-17 (×4): 1000 mg via ORAL
  Filled 2021-12-16 (×4): qty 2

## 2021-12-16 MED ORDER — CEFAZOLIN IN SODIUM CHLORIDE 3-0.9 GM/100ML-% IV SOLN
3.0000 g | INTRAVENOUS | Status: AC
  Administered 2021-12-16: 3 g via INTRAVENOUS
  Filled 2021-12-16: qty 100

## 2021-12-16 MED ORDER — FLEET ENEMA 7-19 GM/118ML RE ENEM: 1.0000 | ENEMA | Freq: Once | RECTAL | Status: DC | PRN

## 2021-12-16 MED ORDER — TRANEXAMIC ACID 1000 MG/10ML IV SOLN
2000.0000 mg | Freq: Once | INTRAVENOUS | Status: DC
  Filled 2021-12-16 (×2): qty 20

## 2021-12-16 MED ORDER — PROPOFOL 500 MG/50ML IV EMUL
INTRAVENOUS | Status: AC
  Filled 2021-12-16: qty 50

## 2021-12-16 MED ORDER — CEFAZOLIN SODIUM-DEXTROSE 2-4 GM/100ML-% IV SOLN
2.0000 g | Freq: Four times a day (QID) | INTRAVENOUS | Status: AC
  Administered 2021-12-16 (×2): 2 g via INTRAVENOUS
  Filled 2021-12-16 (×2): qty 100

## 2021-12-16 MED ORDER — MIDAZOLAM HCL 2 MG/2ML IJ SOLN
INTRAMUSCULAR | Status: AC
  Filled 2021-12-16: qty 2

## 2021-12-16 MED ORDER — FENTANYL CITRATE PF 50 MCG/ML IJ SOSY
50.0000 ug | PREFILLED_SYRINGE | INTRAMUSCULAR | Status: DC
  Administered 2021-12-16: 50 ug via INTRAVENOUS
  Filled 2021-12-16: qty 2

## 2021-12-16 MED ORDER — DEXAMETHASONE SODIUM PHOSPHATE 10 MG/ML IJ SOLN
8.0000 mg | Freq: Once | INTRAMUSCULAR | Status: AC
  Administered 2021-12-16: 8 mg via INTRAVENOUS

## 2021-12-16 MED ORDER — BUPIVACAINE IN DEXTROSE 0.75-8.25 % IT SOLN
INTRATHECAL | Status: DC | PRN
  Administered 2021-12-16: 1.6 mL via INTRATHECAL

## 2021-12-16 MED ORDER — DIPHENHYDRAMINE HCL 12.5 MG/5ML PO ELIX: 12.5000 mg | ORAL_SOLUTION | ORAL | Status: DC | PRN

## 2021-12-16 MED ORDER — TRANEXAMIC ACID 1000 MG/10ML IV SOLN
INTRAVENOUS | Status: DC | PRN
  Administered 2021-12-16: 2000 mg via TOPICAL

## 2021-12-16 MED ORDER — DEXAMETHASONE SODIUM PHOSPHATE 10 MG/ML IJ SOLN
10.0000 mg | Freq: Once | INTRAMUSCULAR | Status: AC
  Administered 2021-12-17: 10 mg via INTRAVENOUS
  Filled 2021-12-16: qty 1

## 2021-12-16 MED ORDER — MIDAZOLAM HCL 2 MG/2ML IJ SOLN
INTRAMUSCULAR | Status: DC | PRN
  Administered 2021-12-16: 2 mg via INTRAVENOUS

## 2021-12-16 MED ORDER — BUPIVACAINE LIPOSOME 1.3 % IJ SUSP: 20.0000 mL | Freq: Once | INTRAMUSCULAR | Status: DC

## 2021-12-16 MED ORDER — SODIUM CHLORIDE (PF) 0.9 % IJ SOLN
INTRAMUSCULAR | Status: DC | PRN
  Administered 2021-12-16: 60 mL via INTRAVENOUS

## 2021-12-16 MED ORDER — FENTANYL CITRATE (PF) 100 MCG/2ML IJ SOLN
INTRAMUSCULAR | Status: AC
  Filled 2021-12-16: qty 2

## 2021-12-16 MED ORDER — BUPIVACAINE LIPOSOME 1.3 % IJ SUSP
INTRAMUSCULAR | Status: DC | PRN
  Administered 2021-12-16: 20 mL

## 2021-12-16 MED ORDER — ONDANSETRON HCL 4 MG/2ML IJ SOLN
4.0000 mg | Freq: Once | INTRAMUSCULAR | Status: AC | PRN
Start: 2021-12-16 — End: 2021-12-16
  Administered 2021-12-16: 4 mg via INTRAVENOUS

## 2021-12-16 MED ORDER — SODIUM CHLORIDE 0.9 % IR SOLN
Status: DC | PRN
  Administered 2021-12-16: 1000 mL

## 2021-12-16 MED ORDER — POLYETHYLENE GLYCOL 3350 17 G PO PACK: 17.0000 g | PACK | Freq: Every day | ORAL | Status: DC | PRN

## 2021-12-16 MED ORDER — SODIUM CHLORIDE (PF) 0.9 % IJ SOLN
INTRAMUSCULAR | Status: AC
  Filled 2021-12-16: qty 50

## 2021-12-16 MED ORDER — PROPOFOL 500 MG/50ML IV EMUL
INTRAVENOUS | Status: DC | PRN
  Administered 2021-12-16: 75 ug/kg/min via INTRAVENOUS

## 2021-12-16 MED ORDER — IRBESARTAN 150 MG PO TABS
150.0000 mg | ORAL_TABLET | Freq: Every day | ORAL | Status: DC
  Administered 2021-12-17 – 2021-12-18 (×2): 150 mg via ORAL
  Filled 2021-12-16 (×2): qty 1

## 2021-12-16 SURGICAL SUPPLY — 59 items
ATTUNE MED DOME PAT 38 KNEE (Knees) ×1 IMPLANT
ATTUNE PS FEM LT SZ 8 CEM KNEE (Femur) ×1 IMPLANT
ATTUNE PSRP INSR SZ8 8 KNEE (Insert) ×1 IMPLANT
BAG COUNTER SPONGE SURGICOUNT (BAG) ×1 IMPLANT
BAG ZIPLOCK 12X15 (MISCELLANEOUS) ×2 IMPLANT
BASE TIBIAL ROT PLAT SZ 8 KNEE (Knees) IMPLANT
BLADE SAG 18X100X1.27 (BLADE) ×2 IMPLANT
BLADE SAW SGTL 11.0X1.19X90.0M (BLADE) ×2 IMPLANT
BNDG ELASTIC 6X5.8 VLCR STR LF (GAUZE/BANDAGES/DRESSINGS) ×2 IMPLANT
BOWL SMART MIX CTS (DISPOSABLE) ×2 IMPLANT
CEMENT HV SMART SET (Cement) ×4 IMPLANT
CLSR STERI-STRIP ANTIMIC 1/2X4 (GAUZE/BANDAGES/DRESSINGS) ×1 IMPLANT
COVER SURGICAL LIGHT HANDLE (MISCELLANEOUS) ×2 IMPLANT
CUFF TOURN SGL QUICK 34 (TOURNIQUET CUFF)
CUFF TOURN SGL QUICK 42 (TOURNIQUET CUFF) ×1 IMPLANT
CUFF TRNQT CYL 34X4.125X (TOURNIQUET CUFF) ×1 IMPLANT
DRAPE INCISE IOBAN 66X45 STRL (DRAPES) ×2 IMPLANT
DRAPE U-SHAPE 47X51 STRL (DRAPES) ×2 IMPLANT
DRSG AQUACEL AG ADV 3.5X10 (GAUZE/BANDAGES/DRESSINGS) ×2 IMPLANT
DURAPREP 26ML APPLICATOR (WOUND CARE) ×2 IMPLANT
ELECT REM PT RETURN 15FT ADLT (MISCELLANEOUS) ×2 IMPLANT
GLOVE BIO SURGEON STRL SZ 6.5 (GLOVE) IMPLANT
GLOVE BIO SURGEON STRL SZ7.5 (GLOVE) IMPLANT
GLOVE BIO SURGEON STRL SZ8 (GLOVE) ×2 IMPLANT
GLOVE BIOGEL PI IND STRL 6.5 (GLOVE) IMPLANT
GLOVE BIOGEL PI IND STRL 7.0 (GLOVE) IMPLANT
GLOVE BIOGEL PI IND STRL 8 (GLOVE) ×1 IMPLANT
GLOVE BIOGEL PI INDICATOR 6.5 (GLOVE)
GLOVE BIOGEL PI INDICATOR 7.0 (GLOVE)
GLOVE BIOGEL PI INDICATOR 8 (GLOVE) ×1
GOWN STRL REUS W/ TWL LRG LVL3 (GOWN DISPOSABLE) ×1 IMPLANT
GOWN STRL REUS W/ TWL XL LVL3 (GOWN DISPOSABLE) IMPLANT
GOWN STRL REUS W/TWL LRG LVL3 (GOWN DISPOSABLE) ×1
GOWN STRL REUS W/TWL XL LVL3 (GOWN DISPOSABLE)
HANDPIECE INTERPULSE COAX TIP (DISPOSABLE) ×1
HOLDER FOLEY CATH W/STRAP (MISCELLANEOUS) ×1 IMPLANT
IMMOBILIZER KNEE 20 (SOFTGOODS) ×2
IMMOBILIZER KNEE 20 THIGH 36 (SOFTGOODS) ×1 IMPLANT
KIT TURNOVER KIT A (KITS) ×1 IMPLANT
MANIFOLD NEPTUNE II (INSTRUMENTS) ×2 IMPLANT
NS IRRIG 1000ML POUR BTL (IV SOLUTION) ×2 IMPLANT
PACK TOTAL KNEE CUSTOM (KITS) ×2 IMPLANT
PADDING CAST COTTON 6X4 STRL (CAST SUPPLIES) ×4 IMPLANT
PADDING CAST SYN 6 (CAST SUPPLIES) ×1
PADDING CAST SYNTHETIC 6X4 NS (CAST SUPPLIES) IMPLANT
PROTECTOR NERVE ULNAR (MISCELLANEOUS) ×2 IMPLANT
SET HNDPC FAN SPRY TIP SCT (DISPOSABLE) ×1 IMPLANT
SPIKE FLUID TRANSFER (MISCELLANEOUS) ×2 IMPLANT
STRIP CLOSURE SKIN 1/2X4 (GAUZE/BANDAGES/DRESSINGS) ×4 IMPLANT
SUT MNCRL AB 4-0 PS2 18 (SUTURE) ×2 IMPLANT
SUT STRATAFIX 0 PDS 27 VIOLET (SUTURE) ×2
SUT VIC AB 2-0 CT1 27 (SUTURE) ×3
SUT VIC AB 2-0 CT1 TAPERPNT 27 (SUTURE) ×3 IMPLANT
SUTURE STRATFX 0 PDS 27 VIOLET (SUTURE) ×1 IMPLANT
TIBIAL BASE ROT PLAT SZ 8 KNEE (Knees) ×2 IMPLANT
TRAY FOLEY MTR SLVR 16FR STAT (SET/KITS/TRAYS/PACK) ×2 IMPLANT
TUBE SUCTION HIGH CAP CLEAR NV (SUCTIONS) ×2 IMPLANT
WATER STERILE IRR 1000ML POUR (IV SOLUTION) ×4 IMPLANT
WRAP KNEE MAXI GEL POST OP (GAUZE/BANDAGES/DRESSINGS) ×2 IMPLANT

## 2021-12-16 NOTE — Discharge Instructions (Addendum)
 Frank Aluisio, MD Total Joint Specialist [facility] [address] [place], [place] [address] [phone]  TOTAL KNEE REPLACEMENT POSTOPERATIVE DIRECTIONS    Knee Rehabilitation, Guidelines Following Surgery  Results after knee surgery are often greatly improved when you follow the exercise, range of motion and muscle strengthening exercises prescribed by your doctor. Safety measures are also important to protect the knee from further injury. If any of these exercises cause you to have increased pain or swelling in your knee joint, decrease the amount until you are comfortable again and slowly increase them. If you have problems or questions, call your caregiver or physical therapist for advice.   HOME CARE INSTRUCTIONS  Remove items at home which could result in a fall. This includes throw rugs or furniture in walking pathways.  ICE to the affected knee as much as tolerated. Icing helps control swelling. If the swelling is well controlled you will be more comfortable and rehab easier. Continue to use ice on the knee for pain and swelling from surgery. You may notice swelling that will progress down to the foot and ankle. This is normal after surgery. Elevate the leg when you are not up walking on it.    Continue to use the breathing machine which will help keep your temperature down. It is common for your temperature to cycle up and down following surgery, especially at night when you are not up moving around and exerting yourself. The breathing machine keeps your lungs expanded and your temperature down. Do not place pillow under the operative knee, focus on keeping the knee straight while resting  DIET You may resume your previous home diet once you are discharged from the hospital.  DRESSING / WOUND CARE / SHOWERING Keep your bulky bandage on for 2 days. On the third post-operative day you may remove the Ace bandage and gauze. There is a waterproof  adhesive bandage on your skin which will stay in place until your first follow-up appointment. Once you remove this you will not need to place another bandage You may begin showering 3 days following surgery, but do not submerge the incision under water.  ACTIVITY For the first 5 days, the key is rest and control of pain and swelling Do your home exercises twice a day starting on post-operative day 3. On the days you go to physical therapy, just do the home exercises once that day. You should rest, ice and elevate the leg for 50 minutes out of every hour. Get up and walk/stretch for 10 minutes per hour. After 5 days you can increase your activity slowly as tolerated. Walk with your walker as instructed. Use the walker until you are comfortable transitioning to a cane. Walk with the cane in the opposite hand of the operative leg. You may discontinue the cane once you are comfortable and walking steadily. Avoid periods of inactivity such as sitting longer than an hour when not asleep. This helps prevent blood clots.  You may discontinue the knee immobilizer once you are able to perform a straight leg raise while lying down. You may resume a sexual relationship in one month or when given the OK by your doctor.  You may return to work once you are cleared by your doctor.  Do not drive a car for 6 weeks or until released by your surgeon.  Do not drive while taking narcotics.  TED HOSE STOCKINGS Wear the elastic stockings on both legs for three weeks following surgery during the   day. You may remove them at night for sleeping.  WEIGHT BEARING Weight bearing as tolerated with assist device (walker, cane, etc) as directed, use it as long as suggested by your surgeon or therapist, typically at least 4-6 weeks.  POSTOPERATIVE CONSTIPATION PROTOCOL Constipation - defined medically as fewer than three stools per week and severe constipation as less than one stool per week.  One of the most common issues  patients have following surgery is constipation.  Even if you have a regular bowel pattern at home, your normal regimen is likely to be disrupted due to multiple reasons following surgery.  Combination of anesthesia, postoperative narcotics, change in appetite and fluid intake all can affect your bowels.  In order to avoid complications following surgery, here are some recommendations in order to help you during your recovery period.  Colace (docusate) - Pick up an over-the-counter form of Colace or another stool softener and take twice a day as long as you are requiring postoperative pain medications.  Take with a full glass of water daily.  If you experience loose stools or diarrhea, hold the colace until you stool forms back up. If your symptoms do not get better within 1 week or if they get worse, check with your doctor. Dulcolax (bisacodyl) - Pick up over-the-counter and take as directed by the product packaging as needed to assist with the movement of your bowels.  Take with a full glass of water.  Use this product as needed if not relieved by Colace only.  MiraLax (polyethylene glycol) - Pick up over-the-counter to have on hand. MiraLax is a solution that will increase the amount of water in your bowels to assist with bowel movements.  Take as directed and can mix with a glass of water, juice, soda, coffee, or tea. Take if you go more than two days without a movement. Do not use MiraLax more than once per day. Call your doctor if you are still constipated or irregular after using this medication for 7 days in a row.  If you continue to have problems with postoperative constipation, please contact the office for further assistance and recommendations.  If you experience "the worst abdominal pain ever" or develop nausea or vomiting, please contact the office immediatly for further recommendations for treatment.  ITCHING If you experience itching with your medications, try taking only a single pain  pill, or even half a pain pill at a time.  You can also use Benadryl over the counter for itching or also to help with sleep.   MEDICATIONS See your medication summary on the "After Visit Summary" that the nursing staff will review with you prior to discharge.  You may have some home medications which will be placed on hold until you complete the course of blood thinner medication.  It is important for you to complete the blood thinner medication as prescribed by your surgeon.  Continue your approved medications as instructed at time of discharge.  PRECAUTIONS If you experience chest pain or shortness of breath - call 911 immediately for transfer to the hospital emergency department.  If you develop a fever greater that 101 F, purulent drainage from wound, increased redness or drainage from wound, foul odor from the wound/dressing, or calf pain - CONTACT YOUR SURGEON.                                                     FOLLOW-UP APPOINTMENTS Make sure you keep all of your appointments after your operation with your surgeon and caregivers. You should call the office at the above phone number and make an appointment for approximately two weeks after the date of your surgery or on the date instructed by your surgeon outlined in the "After Visit Summary".  RANGE OF MOTION AND STRENGTHENING EXERCISES  Rehabilitation of the knee is important following a knee injury or an operation. After just a few days of immobilization, the muscles of the thigh which control the knee become weakened and shrink (atrophy). Knee exercises are designed to build up the tone and strength of the thigh muscles and to improve knee motion. Often times heat used for twenty to thirty minutes before working out will loosen up your tissues and help with improving the range of motion but do not use heat for the first two weeks following surgery. These exercises can be done on a training (exercise) mat, on the floor, on a table or on a bed.  Use what ever works the best and is most comfortable for you Knee exercises include:  Leg Lifts - While your knee is still immobilized in a splint or cast, you can do straight leg raises. Lift the leg to 60 degrees, hold for 3 sec, and slowly lower the leg. Repeat 10-20 times 2-3 times daily. Perform this exercise against resistance later as your knee gets better.  Quad and Hamstring Sets - Tighten up the muscle on the front of the thigh (Quad) and hold for 5-10 sec. Repeat this 10-20 times hourly. Hamstring sets are done by pushing the foot backward against an object and holding for 5-10 sec. Repeat as with quad sets.  Leg Slides: Lying on your back, slowly slide your foot toward your buttocks, bending your knee up off the floor (only go as far as is comfortable). Then slowly slide your foot back down until your leg is flat on the floor again. Angel Wings: Lying on your back spread your legs to the side as far apart as you can without causing discomfort.  A rehabilitation program following serious knee injuries can speed recovery and prevent re-injury in the future due to weakened muscles. Contact your doctor or a physical therapist for more information on knee rehabilitation.   POST-OPERATIVE OPIOID TAPER INSTRUCTIONS: It is important to wean off of your opioid medication as soon as possible. If you do not need pain medication after your surgery it is ok to stop day one. Opioids include: Codeine, Hydrocodone(Norco, Vicodin), Oxycodone(Percocet, oxycontin) and hydromorphone amongst others.  Long term and even short term use of opiods can cause: Increased pain response Dependence Constipation Depression Respiratory depression And more.  Withdrawal symptoms can include Flu like symptoms Nausea, vomiting And more Techniques to manage these symptoms Hydrate well Eat regular healthy meals Stay active Use relaxation techniques(deep breathing, meditating, yoga) Do Not substitute Alcohol to help  with tapering If you have been on opioids for less than two weeks and do not have pain than it is ok to stop all together.  Plan to wean off of opioids This plan should start within one week post op of your joint replacement. Maintain the same interval or time between taking each dose and first decrease the dose.  Cut the total daily intake of opioids by one tablet each day Next start to increase the time between doses. The last dose that should be eliminated is the evening dose.   IF YOU ARE TRANSFERRED TO   A SKILLED REHAB FACILITY If the patient is transferred to a skilled rehab facility following release from the hospital, a list of the current medications will be sent to the facility for the patient to continue.  When discharged from the skilled rehab facility, please have the facility set up the patient's [place] prior to being released. Also, the skilled facility will be responsible for providing the patient with their medications at time of release from the facility to include their pain medication, the muscle relaxants, and their blood thinner medication. If the patient is still at the rehab facility at time of the two week follow up appointment, the skilled rehab facility will also need to assist the patient in arranging follow up appointment in our office and any transportation needs.  MAKE SURE YOU:  Understand these instructions.  Get help right away if you are not doing well or get worse.   DENTAL ANTIBIOTICS:  In most cases prophylactic antibiotics for Dental procdeures after total joint surgery are not necessary.  Exceptions are as follows:  1. History of prior total joint infection  2. Severely immunocompromised (Organ Transplant, cancer chemotherapy, Rheumatoid biologic medications such as [place])  3. Poorly controlled diabetes (A1C &gt; 8.0, blood glucose over 200)  If you have one of these conditions, contact your surgeon for an antibiotic  prescription, prior to your dental procedure.    Pick up stool softner and laxative for home use following surgery while on pain medications. Do not submerge incision under water. Please use good hand washing techniques while changing dressing each day. May shower starting three days after surgery. Please use a clean towel to pat the incision dry following showers. Continue to use ice for pain and swelling after surgery. Do not use any lotions or creams on the incision until instructed by your surgeon.   -----------------------------------------------------------------------------------------------------------------------------------------------------------------------------------------------------------------------   Information on my medicine - XARELTO (Rivaroxaban) Why was Xarelto prescribed for you? Xarelto was prescribed for you to reduce the risk of a blood clot forming that can cause a stroke if you have a medical condition called atrial fibrillation (a type of irregular heartbeat).  What do you need to know about xarelto ? Take your Xarelto ONCE DAILY at the same time every day with your evening meal. If you have difficulty swallowing the tablet whole, you may crush it and mix in applesauce just prior to taking your dose.  Take Xarelto exactly as prescribed by your doctor and DO NOT stop taking Xarelto without talking to the doctor who prescribed the medication.  Stopping without other stroke prevention medication to take the place of Xarelto may increase your risk of developing a clot that causes a stroke.  Refill your prescription before you run out.  After discharge, you should have regular check-up appointments with your healthcare provider that is prescribing your Xarelto.  In the future your dose may need to be changed if your kidney function or weight changes by a significant amount.  What do you do if you miss a dose? If you are taking Xarelto ONCE DAILY and you  miss a dose, take it as soon as you remember on the same day then continue your regularly scheduled once daily regimen the next day. Do not take two doses of Xarelto at the same time or on the same day.   Important Safety Information A possible side effect of Xarelto is bleeding. You should call your healthcare provider right away if you experience any of the following:  Bleeding from an injury or your nose that does not stop. Unusual colored urine (red or dark brown) or unusual colored stools (red or black). Unusual bruising for unknown reasons. A serious fall or if you hit your head (even if there is no bleeding).  Some medicines may interact with Xarelto and might increase your risk of bleeding while on Xarelto. To help avoid this, consult your healthcare provider or pharmacist prior to using any new prescription or non-prescription medications, including herbals, vitamins, non-steroidal anti-inflammatory drugs (NSAIDs) and supplements.  This website has more information on Xarelto: [email or web address].[email or web address]

## 2021-12-16 NOTE — Transfer of Care (Signed)
Immediate Anesthesia Transfer of Care Note  Patient: Jordan [name]  Procedure(s) Performed: TOTAL KNEE ARTHROPLASTY (Left: Knee)  Patient Location: PACU  Anesthesia Type:Spinal and MAC combined with regional for post-op pain  Level of Consciousness: awake, alert  and oriented  Airway & Oxygen Therapy: Patient Spontanous Breathing and Patient connected to face mask oxygen  Post-op Assessment: Report given to RN, Post -op Vital signs reviewed and stable and BP 153/91, Pulse 73  Resp 12   Pulse Ox 100%  Post vital signs: Reviewed and stable  Last Vitals:  Vitals Value Taken Time  BP    Temp    Pulse    Resp    SpO2      Last Pain:  Vitals:   12/16/21 0628  TempSrc:   PainSc: 5       Patients Stated Pain Goal: 4 (82/[id number])  Complications: No notable events documented.

## 2021-12-16 NOTE — Anesthesia Preprocedure Evaluation (Signed)
Anesthesia Evaluation  Patient identified by MRN, date of birth, ID band Patient awake    Reviewed: Allergy & Precautions, NPO status , Patient's Chart, lab work & pertinent test results  History of Anesthesia Complications (+) PONV and history of anesthetic complications  Airway Mallampati: II  TM Distance: >3 FB Neck ROM: Full    Dental no notable dental hx.    Pulmonary neg pulmonary ROS,    Pulmonary exam normal breath sounds clear to auscultation       Cardiovascular hypertension, + DVT  Normal cardiovascular exam Rhythm:Regular Rate:Normal     Neuro/Psych negative neurological ROS  negative psych ROS   GI/Hepatic negative GI ROS, Neg liver ROS,   Endo/Other  Hypothyroidism Morbid obesity  Renal/GU negative Renal ROS  negative genitourinary   Musculoskeletal  (+) Arthritis , Osteoarthritis,    Abdominal   Peds negative pediatric ROS (+)  Hematology negative hematology ROS (+)   Anesthesia Other Findings   Reproductive/Obstetrics negative OB ROS                             Anesthesia Physical Anesthesia Plan  ASA: 3  Anesthesia Plan: Spinal   Post-op Pain Management:    Induction: Intravenous  PONV Risk Score and Plan: 3 and Propofol infusion and Treatment may vary due to age or medical condition  Airway Management Planned: Simple Face Mask  Additional Equipment:   Intra-op Plan:   Post-operative Plan:   Informed Consent: I have reviewed the patients History and Physical, chart, labs and discussed the procedure including the risks, benefits and alternatives for the proposed anesthesia with the patient or authorized representative who has indicated his/her understanding and acceptance.     Dental advisory given  Plan Discussed with: CRNA and Surgeon  Anesthesia Plan Comments:         Anesthesia Quick Evaluation

## 2021-12-16 NOTE — Progress Notes (Signed)
PT Cancellation Note  Patient Details Name: Jordan [name] MRN: [id number] DOB: September 02, 1950   Cancelled Treatment:    Reason Eval/Treat Not Completed: Fatigue/lethargy limiting ability to participate (Pt nauseated prior to leaving PACU and very solomnent in room.) Will follow up as schedule and pt status allows.  Coolidge Breeze, PT, DPT [facility] Department Office: [phone] Pager: [phone]   Coolidge Breeze 12/16/2021, 12:54 PM

## 2021-12-16 NOTE — Progress Notes (Signed)
Orthopedic Tech Progress Note Patient Details:  Jordan [name] 12/22/1950 [id number]  CPM Left Knee CPM Left Knee: On Left Knee Flexion (Degrees): 40 Left Knee Extension (Degrees): 10  Post Interventions Patient Tolerated: Well Instructions Provided: Care of device, Adjustment of device  [place] Pink 12/16/2021, 10:26 AM

## 2021-12-16 NOTE — Interval H&P Note (Signed)
History and Physical Interval Note:  12/16/2021 6:38 AM  Jordan [name]  has presented today for surgery, with the diagnosis of left knee osteoarthritis.  The various methods of treatment have been discussed with the patient and family. After consideration of risks, benefits and other options for treatment, the patient has consented to  Procedure(s): TOTAL KNEE ARTHROPLASTY (Left) as a surgical intervention.  The patient's history has been reviewed, patient examined, no change in status, stable for surgery.  I have reviewed the patient's chart and labs.  Questions were answered to the patient's satisfaction.     Pilar Plate Jordan [name]

## 2021-12-16 NOTE — Anesthesia Postprocedure Evaluation (Signed)
Anesthesia Post Note  Patient: Public house manager  Procedure(s) Performed: TOTAL KNEE ARTHROPLASTY (Left: Knee)     Patient location during evaluation: PACU Anesthesia Type: Spinal Level of consciousness: oriented and awake and alert Pain management: pain level controlled Vital Signs Assessment: post-procedure vital signs reviewed and stable Respiratory status: spontaneous breathing, respiratory function stable and patient connected to nasal cannula oxygen Cardiovascular status: blood pressure returned to baseline and stable Postop Assessment: no headache, no backache and no apparent nausea or vomiting Anesthetic complications: no   No notable events documented.  Last Vitals:  Vitals:   12/16/21 1130 12/16/21 1151  BP: (!) 155/89 138/80  Pulse: 65 62  Resp: 14 14  Temp:    SpO2: 98% 100%    Last Pain:  Vitals:   12/16/21 1151  TempSrc: Axillary  PainSc: Asleep                 Kanai Berrios S

## 2021-12-16 NOTE — Progress Notes (Signed)
Assisted Dr. Rose with left, adductor canal block. Side rails up, monitors on throughout procedure. See vital signs in flow sheet. Tolerated Procedure well.  

## 2021-12-16 NOTE — Anesthesia Procedure Notes (Signed)
Procedure Name: MAC Date/Time: 12/16/2021 8:10 AM Performed by: Lollie Sails, CRNA Pre-anesthesia Checklist: Patient identified, Emergency Drugs available, Suction available, Patient being monitored and Timeout performed Oxygen Delivery Method: Simple face mask Placement Confirmation: positive ETCO2

## 2021-12-16 NOTE — Anesthesia Procedure Notes (Signed)
Anesthesia Procedure Image    

## 2021-12-16 NOTE — Plan of Care (Signed)
  Problem: Clinical Measurements: Goal: Ability to maintain clinical measurements within normal limits will improve Outcome: Progressing   Problem: Activity: Goal: Risk for activity intolerance will decrease Outcome: Progressing   Problem: Pain Managment: Goal: General experience of comfort will improve Outcome: Progressing   Problem: Safety: Goal: Ability to remain free from injury will improve Outcome: Progressing   

## 2021-12-16 NOTE — Anesthesia Procedure Notes (Signed)
Spinal  Patient location during procedure: OR Start time: 12/16/2021 8:10 AM End time: 12/16/2021 8:14 AM Reason for block: surgical anesthesia Staffing Performed: resident/CRNA  Resident/CRNA: Lollie Sails, CRNA Preanesthetic Checklist Completed: patient identified, IV checked, site marked, risks and benefits discussed, surgical consent, monitors and equipment checked, pre-op evaluation and timeout performed Spinal Block Patient position: sitting Prep: DuraPrep and site prepped and draped Patient monitoring: heart rate, continuous pulse ox, blood pressure and cardiac monitor Approach: midline Location: L3-4 Injection technique: single-shot Needle Needle type: Pencan  Needle gauge: 24 G Needle length: 10 cm Assessment Events: CSF return Additional Notes Expiration date on kit noted and within range.  Sterile prep and drape.    Local with Lidocaine 1%.  Good CSF flow noted with no heme or c/o paresthesia.   Patient tolerated well.

## 2021-12-16 NOTE — Anesthesia Procedure Notes (Signed)
Anesthesia Regional Block: Adductor canal block   Pre-Anesthetic Checklist: , timeout performed,  Correct Patient, Correct Site, Correct Laterality,  Correct Procedure, Correct Position, site marked,  Risks and benefits discussed,  Surgical consent,  Pre-op evaluation,  At surgeon's request and post-op pain management  Laterality: Left  Prep: chloraprep       Needles:  Injection technique: Single-shot  Needle Type: Echogenic Needle     Needle Length: 9cm      Additional Needles:   Procedures:,,,, ultrasound used (permanent image in chart),,    Narrative:  Start time: 12/16/2021 7:42 AM End time: 12/16/2021 7:48 AM Injection made incrementally with aspirations every 5 mL.  Performed by: Personally  Anesthesiologist: Myrtie Soman, MD  Additional Notes: Patient tolerated the procedure well without complications

## 2021-12-16 NOTE — Op Note (Signed)
OPERATIVE REPORT-TOTAL KNEE ARTHROPLASTY   Pre-operative diagnosis- Osteoarthritis  Left knee(s)  Post-operative diagnosis- Osteoarthritis Left knee(s)  Procedure-  Left  Total Knee Arthroplasty  Surgeon- Dione Plover. Abran Gavigan, MD  Assistant- Jaynie Bream, PA-C   Anesthesia-  Regional and Spinal  EBL-200 mL   Drains None  Tourniquet time-  Total Tourniquet Time Documented: Thigh (Left) - 56 minutes Total: Thigh (Left) - 56 minutes     Complications- None  Condition-PACU - hemodynamically stable.   Brief Clinical Note  Jordan [name] is a 71 y.o. year old female with end stage OA of her left knee with progressively worsening pain and dysfunction. She has constant pain, with activity and at rest and significant functional deficits with difficulties even with ADLs. She has had extensive non-op management including analgesics, injections of cortisone and viscosupplements, and home exercise program, but remains in significant pain with significant dysfunction. Radiographs show bone on bone arthritis medial and patellofemoral. She presents now for left Total Knee Arthroplasty.     Procedure in detail---   The patient is brought into the operating room and positioned supine on the operating table. After successful administration of  Regional and Spinal,   a tourniquet is placed high on the  Left thigh(s) and the lower extremity is prepped and draped in the usual sterile fashion. Time out is performed by the operating team and then the  Left lower extremity is wrapped in Esmarch, knee flexed and the tourniquet inflated to 300 mmHg.       A midline incision is made with a ten blade through the subcutaneous tissue to the level of the extensor mechanism. A fresh blade is used to make a medial parapatellar arthrotomy. Soft tissue over the proximal medial tibia is subperiosteally elevated to the joint line with a knife and into the semimembranosus bursa with a Cobb elevator. Soft tissue over  the proximal lateral tibia is elevated with attention being paid to avoiding the patellar tendon on the tibial tubercle. The patella is everted, knee flexed 90 degrees and the ACL and PCL are removed. Findings are bone on bone all 3 compartments with massive global osteophytes.        The drill is used to create a starting hole in the distal femur and the canal is thoroughly irrigated with sterile saline to remove the fatty contents. The 5 degree Left  valgus alignment guide is placed into the femoral canal and the distal femoral cutting block is pinned to remove 9 mm off the distal femur. Resection is made with an oscillating saw.      The tibia is subluxed forward and the menisci are removed. The extramedullary alignment guide is placed referencing proximally at the medial aspect of the tibial tubercle and distally along the second metatarsal axis and tibial crest. The block is pinned to remove 78m off the more deficient medial  side. Resection is made with an oscillating saw. Size 8is the most appropriate size for the tibia and the proximal tibia is prepared with the modular drill and keel punch for that size.      The femoral sizing guide is placed and size 8 is most appropriate. Rotation is marked off the epicondylar axis and confirmed by creating a rectangular flexion gap at 90 degrees. The size 8 cutting block is pinned in this rotation and the anterior, posterior and chamfer cuts are made with the oscillating saw. The intercondylar block is then placed and that cut is made.  Trial size 8 tibial component, trial size 8 posterior stabilized femur and a 8  mm posterior stabilized rotating platform insert trial is placed. Full extension is achieved with excellent varus/valgus and anterior/posterior balance throughout full range of motion. The patella is everted and thickness measured to be 25  mm. Free hand resection is taken to 15 mm, a 38 template is placed, lug holes are drilled, trial patella is  placed, and it tracks normally. Osteophytes are removed off the posterior femur with the trial in place. All trials are removed and the cut bone surfaces prepared with pulsatile lavage. Cement is mixed and once ready for implantation, the size 8 tibial implant, size  8 posterior stabilized femoral component, and the size 38 patella are cemented in place and the patella is held with the clamp. The trial insert is placed and the knee held in full extension. The Exparel (20 ml mixed with 60 ml saline) is injected into the extensor mechanism, posterior capsule, medial and lateral gutters and subcutaneous tissues.  All extruded cement is removed and once the cement is hard the permanent 8 mm posterior stabilized rotating platform insert is placed into the tibial tray.      The wound is copiously irrigated with saline solution and the extensor mechanism closed with # 0 Stratofix suture. The tourniquet is released for a total tourniquet time of 56  minutes. Flexion against gravity is 130 degrees and the patella tracks normally. Subcutaneous tissue is closed with 2.0 vicryl and subcuticular with running 4.0 Monocryl. The incision is cleaned and dried and steri-strips and a bulky sterile dressing are applied. The limb is placed into a knee immobilizer and the patient is awakened and transported to recovery in stable condition.      Please note that a surgical assistant was a medical necessity for this procedure in order to perform it in a safe and expeditious manner. Surgical assistant was necessary to retract the ligaments and vital neurovascular structures to prevent injury to them and also necessary for proper positioning of the limb to allow for anatomic placement of the prosthesis.   Dione Plover Jasmine Maceachern, MD    12/16/2021, 9:43 AM

## 2021-12-17 ENCOUNTER — Other Ambulatory Visit (HOSPITAL_COMMUNITY): Payer: Self-pay

## 2021-12-17 ENCOUNTER — Encounter (HOSPITAL_COMMUNITY): Payer: Self-pay | Admitting: Orthopedic Surgery

## 2021-12-17 DIAGNOSIS — Z85850 Personal history of malignant neoplasm of thyroid: Secondary | ICD-10-CM | POA: Diagnosis not present

## 2021-12-17 DIAGNOSIS — Z79890 Hormone replacement therapy (postmenopausal): Secondary | ICD-10-CM | POA: Diagnosis not present

## 2021-12-17 DIAGNOSIS — I10 Essential (primary) hypertension: Secondary | ICD-10-CM | POA: Diagnosis present

## 2021-12-17 DIAGNOSIS — Z7982 Long term (current) use of aspirin: Secondary | ICD-10-CM | POA: Diagnosis not present

## 2021-12-17 DIAGNOSIS — D62 Acute posthemorrhagic anemia: Secondary | ICD-10-CM | POA: Diagnosis not present

## 2021-12-17 DIAGNOSIS — M179 Osteoarthritis of knee, unspecified: Secondary | ICD-10-CM | POA: Diagnosis present

## 2021-12-17 DIAGNOSIS — Z96651 Presence of right artificial knee joint: Secondary | ICD-10-CM | POA: Diagnosis present

## 2021-12-17 DIAGNOSIS — M1712 Unilateral primary osteoarthritis, left knee: Secondary | ICD-10-CM | POA: Diagnosis present

## 2021-12-17 DIAGNOSIS — Z79899 Other long term (current) drug therapy: Secondary | ICD-10-CM | POA: Diagnosis not present

## 2021-12-17 DIAGNOSIS — R55 Syncope and collapse: Secondary | ICD-10-CM | POA: Diagnosis not present

## 2021-12-17 DIAGNOSIS — Z86718 Personal history of other venous thrombosis and embolism: Secondary | ICD-10-CM | POA: Diagnosis not present

## 2021-12-17 DIAGNOSIS — E669 Obesity, unspecified: Secondary | ICD-10-CM | POA: Diagnosis present

## 2021-12-17 DIAGNOSIS — Z888 Allergy status to other drugs, medicaments and biological substances status: Secondary | ICD-10-CM | POA: Diagnosis not present

## 2021-12-17 DIAGNOSIS — I4891 Unspecified atrial fibrillation: Secondary | ICD-10-CM | POA: Diagnosis not present

## 2021-12-17 DIAGNOSIS — I480 Paroxysmal atrial fibrillation: Secondary | ICD-10-CM | POA: Diagnosis not present

## 2021-12-17 DIAGNOSIS — E890 Postprocedural hypothyroidism: Secondary | ICD-10-CM | POA: Diagnosis present

## 2021-12-17 DIAGNOSIS — E876 Hypokalemia: Secondary | ICD-10-CM | POA: Diagnosis not present

## 2021-12-17 DIAGNOSIS — Z885 Allergy status to narcotic agent status: Secondary | ICD-10-CM | POA: Diagnosis not present

## 2021-12-17 DIAGNOSIS — Z6838 Body mass index (BMI) 38.0-38.9, adult: Secondary | ICD-10-CM | POA: Diagnosis not present

## 2021-12-17 DIAGNOSIS — Z8616 Personal history of COVID-19: Secondary | ICD-10-CM | POA: Diagnosis not present

## 2021-12-17 LAB — CBC
HCT: 28.3 % — ABNORMAL LOW (ref 36.0–46.0)
Hemoglobin: 9.3 g/dL — ABNORMAL LOW (ref 12.0–15.0)
MCH: 27.4 pg (ref 26.0–34.0)
MCHC: 32.9 g/dL (ref 30.0–36.0)
MCV: 83.2 fL (ref 80.0–100.0)
Platelets: 155 10*3/uL (ref 150–400)
RBC: 3.4 MIL/uL — ABNORMAL LOW (ref 3.87–5.11)
RDW: 13.4 % (ref 11.5–15.5)
WBC: 9.7 10*3/uL (ref 4.0–10.5)
nRBC: 0 % (ref 0.0–0.2)

## 2021-12-17 LAB — BASIC METABOLIC PANEL
Anion gap: 8 (ref 5–15)
BUN: 17 mg/dL (ref 8–23)
CO2: 27 mmol/L (ref 22–32)
Calcium: 8.2 mg/dL — ABNORMAL LOW (ref 8.9–10.3)
Chloride: 103 mmol/L (ref 98–111)
Creatinine, Ser: 0.96 mg/dL (ref 0.44–1.00)
GFR, Estimated: >60 mL/min (ref >60)
Glucose, Bld: 123 mg/dL — ABNORMAL HIGH (ref 70–99)
Potassium: 3.5 mmol/L (ref 3.5–5.1)
Sodium: 138 mmol/L (ref 135–145)

## 2021-12-17 NOTE — TOC Initial Note (Signed)
Transition of Care [facility]) - Initial/Assessment Note   Patient Details  Name: Jordan [name] MRN: [id number] Date of Birth: [birth year]  Transition of Care [facility]) CM/SW Contact:    Sherie Don, LCSW Phone Number: 12/17/2021, 2:03 PM  Clinical Narrative: Patient is expected to discharge home after working with PT. CSW met with patient to confirm discharge plan and needs. Patient has a rolling walker and raised toilet riser/cover at home, so there are no DME needs at this time. Patient will go home with HHPT and then transition to [place] [facility].  CSW confirmed with ortho PA that HHPT has not been prearranged. Patient reported she has used [facility] before and would like to use the agency again. CSW made HHPT referral to [place] with [facility] as it is connected to [facility] in [place]. Referral was accepted. Patient updated.  Expected Discharge Plan: [place] Barriers to Discharge: No Barriers Identified  Patient Goals and CMS Choice Patient states their goals for this hospitalization and ongoing recovery are:: Discharge home with HHPT through [facility] Medicare.gov Compare Post Acute Care list provided to:: Patient Choice offered to / list presented to : Patient  Expected Discharge Plan and Services Expected Discharge Plan: [place] In-house Referral: Clinical Social Work Discharge Planning Services: CM Consult Post Acute Care Choice: [place] arrangements for the past 2 months: [place]              DME Arranged: N/A DME Agency: NA HH Arranged: PT [place] Agency: [place] Date [place]: 12/17/21 Representative spoke with at [place]: Levada Dy (referral was made through [employer])  Prior Living Arrangements/Services Living arrangements for the past 2 months: [place] Lives with:: Self Patient language and need for interpreter reviewed:: Yes Do you feel safe  going back to the place where you live?: Yes      Need for Family Participation in Patient Care: No (Comment) Care giver support system in place?: Yes (comment) Criminal Activity/Legal Involvement Pertinent to Current Situation/Hospitalization: No - Comment as needed  Activities of Daily Living Home Assistive Devices/Equipment: Contact lenses, Eyeglasses, Blood pressure cuff, Walker (specify type), Shower chair without back, Bedside commode/3-in-1 (walker with no seat) ADL Screening (condition at time of admission) Patient's cognitive ability adequate to safely complete daily activities?: Yes Is the patient deaf or have difficulty hearing?: No Does the patient have difficulty seeing, even when wearing glasses/contacts?: No Does the patient have difficulty concentrating, remembering, or making decisions?: No Patient able to express need for assistance with ADLs?: Yes Does the patient have difficulty dressing or bathing?: No Independently performs ADLs?: Yes (appropriate for developmental age) Does the patient have difficulty walking or climbing stairs?: Yes (due to knee pain) Weakness of Legs: None Weakness of Arms/Hands: None  Permission Sought/Granted Permission sought to share information with : Other (comment) Permission granted to share info w AGENCY: [facility]  Emotional Assessment Appearance:: Appears stated age Attitude/Demeanor/Rapport: Engaged Affect (typically observed): Accepting Orientation: : Oriented to Self, Oriented to Place, Oriented to  Time, Oriented to Situation Alcohol / Substance Use: Not Applicable Psych Involvement: No (comment)  Admission diagnosis:  Osteoarthritis of left knee [M17.12] Osteoarthritis of knee [M17.9] Patient Active Problem List   Diagnosis Date Noted   Osteoarthritis of knee 12/17/2021   Osteoarthritis of left knee 12/16/2021   OA (osteoarthritis) of knee 10/08/2020   Primary osteoarthritis of right knee 10/08/2020   PCP:  Tobe Sos, MD Pharmacy[facility]   [id number] [id number]: [phone]: [phone] Readmission Risk Interventions     View : No data to display.

## 2021-12-17 NOTE — Progress Notes (Signed)
   Subjective: 1 Day Post-Op Procedure(s) (LRB): TOTAL KNEE ARTHROPLASTY (Left) Patient reports pain as mild.   Patient seen in rounds by Dr. Wynelle Link. Patient is well, and has had no acute complaints or problems No issues overnight. Denies chest pain, SOB, or calf pain. Foley catheter removed this AM.  We will begin therapy today.  Objective: Vital signs in last 24 hours: Temp:  [97.5 F (36.4 C)-99.4 F (37.4 C)] 98.9 F (37.2 C) (06/06 0530) Pulse Rate:  [55-85] 78 (06/06 0530) Resp:  [10-18] 18 (06/06 0530) BP: (138-174)/(63-129) 138/63 (06/06 0530) SpO2:  [86 %-100 %] 98 % (06/06 0530)  Intake/Output from previous day:  Intake/Output Summary (Last 24 hours) at 12/17/2021 0657 Last data filed at 12/17/2021 0533 Gross per 24 hour  Intake 2165.08 ml  Output 1600 ml  Net 565.08 ml     Intake/Output this shift: Total I/O In: -  Out: 750 [Urine:750]  Labs: Recent Labs    12/17/21 0327  HGB 9.3*   Recent Labs    12/17/21 0327  WBC 9.7  RBC 3.40*  HCT 28.3*  PLT 155   Recent Labs    12/17/21 0327  NA 138  K 3.5  CL 103  CO2 27  BUN 17  CREATININE 0.96  GLUCOSE 123*  CALCIUM 8.2*   No results for input(s): LABPT, INR in the last 72 hours.  Exam: General - Patient is Alert and Oriented Extremity - Neurologically intact Neurovascular intact Sensation intact distally Dorsiflexion/Plantar flexion intact Dressing - dressing C/D/I Motor Function - intact, moving foot and toes well on exam.   Past Medical History:  Diagnosis Date   Anemia    Arthritis    Cancer ([place])    thyroid removed   COVID    08-02-20   DVT (deep venous thrombosis) (HCC)    L leg   Hypertension    Hypothyroidism    PONV (postoperative nausea and vomiting)    Pre-diabetes    per MD notes on chart pt. denies    Assessment/Plan: 1 Day Post-Op Procedure(s) (LRB): TOTAL KNEE ARTHROPLASTY (Left) Principal Problem:   Osteoarthritis of left knee  Estimated body mass index is  38.17 kg/m as calculated from the following:   Height as of this encounter: '5\' 10"'$  (1.778 m).   Weight as of this encounter: 120.7 kg. Advance diet Up with therapy D/C IV fluids   Patient's anticipated LOS is less than 2 midnights, meeting these requirements: - Lives within 1 hour of care - Has a competent adult at home to recover with post-op recover - NO history of  - Chronic pain requiring opiods  - Diabetes  - Coronary Artery Disease  - Heart failure  - Heart attack  - Stroke  - Cardiac arrhythmia  - Respiratory Failure/COPD  - Renal failure  - Anemia  - Advanced Liver disease  DVT Prophylaxis - Xarelto Weight bearing as tolerated. Continue therapy.  Plan is to go Home after hospital stay. Plan for discharge tomorrow if progresses with therapy and meeting goals. Will place order for HHPT.  Theresa Duty, PA-C Orthopedic Surgery [phone] 12/17/2021, 6:57 AM

## 2021-12-17 NOTE — TOC Initial Note (Deleted)
Transition of Care [facility]) - Initial/Assessment Note    Patient Details  Name: Jordan [name] MRN: [id number] Date of Birth: 07/30/1950  Transition of Care [facility]) CM/SW Contact:    Leeroy Cha, RN Phone Number: 12/17/2021, 8:32 AM  Clinical Narrative:                  Transition of Care [facility]) Screening Note   Patient Details  Name: Jordan [name] Date of Birth: Nov 14, 1950   Transition of Care [facility]) CM/SW Contact:    Leeroy Cha, RN Phone Number: 12/17/2021, 8:32 AM    Transition of Care Department [facility]) has reviewed patient and no TOC needs have been identified at this time. We will continue to monitor patient advancement through interdisciplinary progression rounds. If new patient transition needs arise, please place a TOC consult.    Expected Discharge Plan: Home/Self Care Barriers to Discharge: Continued Medical Work up   Patient Goals and CMS Choice Patient states their goals for this hospitalization and ongoing recovery are:: to go home CMS Medicare.gov Compare Post Acute Care list provided to:: Patient    Expected Discharge Plan and Services Expected Discharge Plan: Home/Self Care   Discharge Planning Services: CM Consult   Living arrangements for the past 2 months: Single Family Home                                      Prior Living Arrangements/Services Living arrangements for the past 2 months: Single Family Home Lives with:: Self Patient language and need for interpreter reviewed:: Yes Do you feel safe going back to the place where you live?: Yes            Criminal Activity/Legal Involvement Pertinent to Current Situation/Hospitalization: No - Comment as needed  Activities of Daily Living Home Assistive Devices/Equipment: Contact lenses, Eyeglasses, Blood pressure cuff, Walker (specify type), Shower chair without back, Bedside commode/3-in-1 (walker with no seat) ADL Screening (condition at time of admission) Patient's cognitive  ability adequate to safely complete daily activities?: Yes Is the patient deaf or have difficulty hearing?: No Does the patient have difficulty seeing, even when wearing glasses/contacts?: No Does the patient have difficulty concentrating, remembering, or making decisions?: No Patient able to express need for assistance with ADLs?: Yes Does the patient have difficulty dressing or bathing?: No Independently performs ADLs?: Yes (appropriate for developmental age) Does the patient have difficulty walking or climbing stairs?: Yes (due to knee pain) Weakness of Legs: None Weakness of Arms/Hands: None  Permission Sought/Granted                  Emotional Assessment       Orientation: : Oriented to Place, Oriented to  Time, Oriented to Self, Oriented to Situation Alcohol / Substance Use: Not Applicable Psych Involvement: No (comment)  Admission diagnosis:  Osteoarthritis of left knee [M17.12] Patient Active Problem List   Diagnosis Date Noted   Osteoarthritis of left knee 12/16/2021   OA (osteoarthritis) of knee 10/08/2020   Primary osteoarthritis of right knee 10/08/2020   PCP:  Tobe Sos, MD [facility]   [id number] [place]Phone: [phone]: [phone]    Social Determinants of Health (SDOH) Interventions    Readmission Risk Interventions     View : No data to display.

## 2021-12-17 NOTE — Evaluation (Signed)
Physical Therapy Evaluation Patient Details Name: Jordan [name] MRN: [id number] DOB: 1951-07-08 Today's Date: 12/17/2021  History of Present Illness  Pt isa 71yo female presenting s/p L-TKA on 12/16/21. PMH: OA, hx of thyroid cancer, DVT, HTN, R-TKA 2022.  Clinical Impression  Pt is s/p TKA resulting in the deficits listed below (see PT Problem List).  Pt will benefit from continued PT.  Pt reports she does not have consistent help at home and wants to stay in the hospital as long as possible. Have discussed with pt that this is not a rehab center. Pt will need to make efforts to regain her mobility and be as independent as possible. Pt had issues with decr BP and syncope last admission in March, hopefully this can be addressed proactively this admission.    Pt will benefit from skilled PT to increase their independence and safety with mobility to allow discharge to the venue listed below.         Recommendations for follow up therapy are one component of a multi-disciplinary discharge planning process, led by the attending physician.  Recommendations may be updated based on patient status, additional functional criteria and insurance authorization.  Follow Up Recommendations Follow physician's recommendations for discharge plan and follow up therapies    Assistance Recommended at Discharge    Patient can return home with the following  Assistance with cooking/housework;Assist for transportation;Help with stairs or ramp for entrance;A little help with bathing/dressing/bathroom    Equipment Recommendations None recommended by PT  Recommendations for Other Services       Functional Status Assessment Patient has had a recent decline in their functional status and demonstrates the ability to make significant improvements in function in a reasonable and predictable amount of time.     Precautions / Restrictions Precautions Required Braces or Orthoses: Knee Immobilizer -  Left Restrictions Weight Bearing Restrictions: No LLE Weight Bearing: Weight bearing as tolerated      Mobility  Bed Mobility Overal bed mobility: Needs Assistance Bed Mobility: Supine to Sit     Supine to sit: Min assist, Mod assist     General bed mobility comments: assist with LLE, cues to self assist, incr time and effort    Transfers Overall transfer level: Needs assistance Equipment used: Rolling walker (2 wheels) Transfers: Sit to/from Stand, Bed to chair/wheelchair/BSC Sit to Stand: Min assist, Mod assist, +2 safety/equipment   Step pivot transfers: Min assist, +2 safety/equipment       General transfer comment: verbal cues for hand placement, LLE position and to power up with RLE; cues for sequencing pivot    Ambulation/Gait Ambulation/Gait assistance: Min assist, +2 safety/equipment, +2 physical assistance Gait Distance (Feet): 12 Feet Assistive device: Rolling walker (2 wheels) Gait Pattern/deviations: Step-to pattern, Decreased stance time - left, Trunk flexed       General Gait Details: verbal cues for sequence, pt reports feeling dizzy and nauseous. RN notified  Stairs            Wheelchair Mobility    Modified Rankin (Stroke Patients Only)       Balance Overall balance assessment: Needs assistance Sitting-balance support: Feet supported, No upper extremity supported Sitting balance-Leahy Scale: Fair     Standing balance support: Reliant on assistive device for balance, During functional activity Standing balance-Leahy Scale: Poor                               Pertinent Vitals/Pain Pain Assessment  Pain Assessment: Faces Faces Pain Scale: Hurts even more Pain Location: L knee Pain Descriptors / Indicators: Grimacing, Sore Pain Intervention(s): Limited activity within patient's tolerance, Monitored during session, Premedicated before session, Repositioned, Ice applied    Home Living Family/patient expects to be  discharged to:: (P) Private residence Living Arrangements: (P) Alone Available Help at Discharge: Friend(s);Available PRN/intermittently Type of Home: House Home Access: Level entry     Alternate Level Stairs-Number of Steps: 14 Home Layout: Multi-level;Bed/bath upstairs (full bath on main level) Home Equipment: Rolling Walker (2 wheels)      Prior Function Prior Level of Function : Independent/Modified Independent             Mobility Comments: pt reports independence       Hand Dominance        Extremity/Trunk Assessment   Upper Extremity Assessment Upper Extremity Assessment: Overall WFL for tasks assessed    Lower Extremity Assessment Lower Extremity Assessment: LLE deficits/detail LLE Deficits / Details: ankle grossly WFL, knee and hip 2/5 LLE: Unable to fully assess due to pain       Communication      Cognition Arousal/Alertness: Awake/alert Behavior During Therapy: WFL for tasks assessed/performed Overall Cognitive Status: Within Functional Limits for tasks assessed                                          General Comments      Exercises Total Joint Exercises Ankle Circles/Pumps: AROM, Both, 10 reps   Assessment/Plan    PT Assessment    PT Problem List         PT Treatment Interventions      PT Goals (Current goals can be found in the Care Plan section)  Acute Rehab PT Goals Patient Stated Goal: I want to stay here as long as I can PT Goal Formulation: With patient Time For Goal Achievement: 12/31/21 Potential to Achieve Goals: Good    Frequency       Co-evaluation               AM-PAC PT "6 Clicks" Mobility  Outcome Measure Help needed turning from your back to your side while in a flat bed without using bedrails?: A Little Help needed moving from lying on your back to sitting on the side of a flat bed without using bedrails?: A Little Help needed moving to and from a bed to a chair (including a  wheelchair)?: A Little Help needed standing up from a chair using your arms (e.g., wheelchair or bedside chair)?: A Little Help needed to walk in hospital room?: A Little Help needed climbing 3-5 steps with a railing? : A Little 6 Click Score: 18    End of Session Equipment Utilized During Treatment: Gait belt Activity Tolerance: Patient tolerated treatment well Patient left: with call bell/phone within reach;in chair;with chair alarm set   PT Visit Diagnosis: Other abnormalities of gait and mobility (R26.89);Muscle weakness (generalized) (M62.81);Difficulty in walking, not elsewhere classified (R26.2)    Time: 5397-6734 PT Time Calculation (min) (ACUTE ONLY): 25 min   Charges:   PT Evaluation $PT Eval Low Complexity: 1 Low PT Treatments $Gait Training: 8-22 mins        Baxter Flattery, PT  Acute Rehab Dept ([place]) [phone] Pager [phone]  12/17/2021    [facility] 12/17/2021, 1:10 PM

## 2021-12-17 NOTE — Progress Notes (Signed)
12/17/21 1500  PT Visit Information  Last PT Received On 12/17/21  Exercise focused session. Pt feels fatigued this pm, continues to express concern about going home without assist however limited motivation to be more independent   Assistance Needed +1  History of Present Illness Pt isa 71yo female presenting s/p L-TKA on 12/16/21. PMH: OA, hx of thyroid cancer, DVT, HTN, R-TKA 2022.  Subjective Data  Patient Stated Goal I want to stay here as long as I can  Precautions  Required Braces or Orthoses Knee Immobilizer - Left  Restrictions  Weight Bearing Restrictions No  LLE Weight Bearing WBAT  Pain Assessment  Pain Assessment Faces  Faces Pain Scale 4  Pain Location L knee  Pain Descriptors / Indicators Grimacing;Sore  Pain Intervention(s) Limited activity within patient's tolerance;Monitored during session;Premedicated before session;Repositioned;Ice applied  Cognition  Arousal/Alertness Awake/alert  Behavior During Therapy WFL for tasks assessed/performed  Overall Cognitive Status Within Functional Limits for tasks assessed  Bed Mobility  General bed mobility comments  (pt just back to bed)  Transfers  Transfers Sit to/from Stand  General transfer comment  (pt declined OOB)  Total Joint Exercises  Ankle Circles/Pumps AROM;Both;10 reps  Quad Sets AROM;Strengthening;Both;10 reps  Heel Slides AROM;Strengthening;Left;10 reps  Hip ABduction/ADduction AROM;AAROM;Left;10 reps  Straight Leg Raises AROM;AAROM;Left;10 reps  Goniometric ROM grossly 12 to 55 degrees  PT - End of Session  Equipment Utilized During Treatment Gait belt  Activity Tolerance Patient tolerated treatment well  Patient left in bed;with call bell/phone within reach;with bed alarm set  Nurse Communication Mobility status   PT - Assessment/Plan  PT Plan Current plan remains appropriate  PT Visit Diagnosis Other abnormalities of gait and mobility (R26.89);Muscle weakness (generalized) (M62.81);Difficulty in  walking, not elsewhere classified (R26.2)  PT Frequency (ACUTE ONLY) 7X/week  Follow Up Recommendations Follow physician's recommendations for discharge plan and follow up therapies  Assistance recommended at discharge Intermittent Supervision/Assistance  Patient can return home with the following Assistance with cooking/housework;Assist for transportation;Help with stairs or ramp for entrance;A little help with bathing/dressing/bathroom  PT equipment None recommended by PT  AM-PAC PT "6 Clicks" Mobility Outcome Measure (Version 2)  Help needed turning from your back to your side while in a flat bed without using bedrails? 3  Help needed moving from lying on your back to sitting on the side of a flat bed without using bedrails? 3  Help needed moving to and from a bed to a chair (including a wheelchair)? 3  Help needed standing up from a chair using your arms (e.g., wheelchair or bedside chair)? 3  Help needed to walk in hospital room? 3  Help needed climbing 3-5 steps with a railing?  3  6 Click Score 18  Consider Recommendation of Discharge To: Home with [facility]  Progressive Mobility  What is the highest level of mobility based on the progressive mobility assessment? Level 5 (Walks with assist in room/hall) - Balance while stepping forward/back and can walk in room with assist - Complete  PT Goal Progression  Progress towards PT goals Progressing toward goals  Acute Rehab PT Goals  PT Goal Formulation With patient  Time For Goal Achievement 12/31/21  Potential to Achieve Goals Good  PT Time Calculation  PT Start Time (ACUTE ONLY) 1535  PT Stop Time (ACUTE ONLY) 1559  PT Time Calculation (min) (ACUTE ONLY) 24 min  PT General Charges  $$ ACUTE PT VISIT 1 Visit  PT Treatments  $Therapeutic Exercise 23-37 mins

## 2021-12-18 DIAGNOSIS — E669 Obesity, unspecified: Secondary | ICD-10-CM | POA: Diagnosis not present

## 2021-12-18 DIAGNOSIS — E890 Postprocedural hypothyroidism: Secondary | ICD-10-CM

## 2021-12-18 DIAGNOSIS — R55 Syncope and collapse: Secondary | ICD-10-CM

## 2021-12-18 DIAGNOSIS — M1712 Unilateral primary osteoarthritis, left knee: Secondary | ICD-10-CM

## 2021-12-18 DIAGNOSIS — I4891 Unspecified atrial fibrillation: Secondary | ICD-10-CM | POA: Diagnosis not present

## 2021-12-18 LAB — CBC
HCT: 28.2 % — ABNORMAL LOW (ref 36.0–46.0)
HCT: 33.1 % — ABNORMAL LOW (ref 36.0–46.0)
Hemoglobin: 9.2 g/dL — ABNORMAL LOW (ref 12.0–15.0)
Hemoglobin: 10.4 g/dL — ABNORMAL LOW (ref 12.0–15.0)
MCH: 27 pg (ref 26.0–34.0)
MCH: 27 pg (ref 26.0–34.0)
MCHC: 32.6 g/dL (ref 30.0–36.0)
MCHC: 31.4 g/dL (ref 30.0–36.0)
MCV: 82.7 fL (ref 80.0–100.0)
MCV: 86 fL (ref 80.0–100.0)
Platelets: 141 10*3/uL — ABNORMAL LOW (ref 150–400)
Platelets: 150 10*3/uL (ref 150–400)
RBC: 3.41 MIL/uL — ABNORMAL LOW (ref 3.87–5.11)
RBC: 3.85 MIL/uL — ABNORMAL LOW (ref 3.87–5.11)
RDW: 13.5 % (ref 11.5–15.5)
RDW: 13.6 % (ref 11.5–15.5)
WBC: 8.8 10*3/uL (ref 4.0–10.5)
WBC: 13.2 10*3/uL — ABNORMAL HIGH (ref 4.0–10.5)
nRBC: 0 % (ref 0.0–0.2)
nRBC: 0 % (ref 0.0–0.2)

## 2021-12-18 LAB — TROPONIN I (HIGH SENSITIVITY)
Troponin I (High Sensitivity): 13 ng/L (ref <18)
Troponin I (High Sensitivity): 12 ng/L (ref <18)

## 2021-12-18 LAB — MAGNESIUM: Magnesium: 2.1 mg/dL (ref 1.7–2.4)

## 2021-12-18 MED ORDER — TRAMADOL HCL 50 MG PO TABS: 50.0000 mg | ORAL_TABLET | Freq: Four times a day (QID) | ORAL | 0 refills | Status: AC | PRN

## 2021-12-18 MED ORDER — METHOCARBAMOL 500 MG PO TABS: 500.0000 mg | ORAL_TABLET | Freq: Four times a day (QID) | ORAL | 0 refills | Status: AC | PRN

## 2021-12-18 MED ORDER — POTASSIUM CHLORIDE CRYS ER 20 MEQ PO TBCR
40.0000 meq | EXTENDED_RELEASE_TABLET | Freq: Once | ORAL | Status: AC
  Administered 2021-12-18: 40 meq via ORAL
  Filled 2021-12-18: qty 2

## 2021-12-18 MED ORDER — DILTIAZEM HCL-DEXTROSE 125-5 MG/125ML-% IV SOLN (PREMIX)
5.0000 mg/h | INTRAVENOUS | Status: DC
  Administered 2021-12-18: 10 mg/h via INTRAVENOUS
  Administered 2021-12-18: 5 mg/h via INTRAVENOUS
  Filled 2021-12-18 (×2): qty 125

## 2021-12-18 MED ORDER — HYDROMORPHONE HCL 2 MG PO TABS: 2.0000 mg | ORAL_TABLET | Freq: Four times a day (QID) | ORAL | 0 refills | Status: AC | PRN

## 2021-12-18 MED ORDER — SODIUM CHLORIDE 0.9 % IV BOLUS
1000.0000 mL | Freq: Once | INTRAVENOUS | Status: AC
  Administered 2021-12-18: 1000 mL via INTRAVENOUS

## 2021-12-18 MED ORDER — ASPIRIN 325 MG PO TBEC: 325.0000 mg | DELAYED_RELEASE_TABLET | Freq: Two times a day (BID) | ORAL | 0 refills | Status: DC

## 2021-12-18 MED ORDER — METOPROLOL TARTRATE 12.5 MG HALF TABLET: 12.5000 mg | ORAL_TABLET | Freq: Two times a day (BID) | ORAL | Status: DC

## 2021-12-18 MED ORDER — RIVAROXABAN 10 MG PO TABS
10.0000 mg | ORAL_TABLET | Freq: Once | ORAL | Status: AC
  Administered 2021-12-18: 10 mg via ORAL
  Filled 2021-12-18: qty 1

## 2021-12-18 MED ORDER — CHLORHEXIDINE GLUCONATE CLOTH 2 % EX PADS
6.0000 | MEDICATED_PAD | Freq: Every day | CUTANEOUS | Status: DC
Start: 2021-12-18 — End: 2021-12-20
  Administered 2021-12-18 – 2021-12-19 (×2): 6 via TOPICAL

## 2021-12-18 MED ORDER — SODIUM CHLORIDE 0.9 % IV SOLN: INTRAVENOUS | Status: AC

## 2021-12-18 MED ORDER — RIVAROXABAN 20 MG PO TABS
20.0000 mg | ORAL_TABLET | Freq: Every day | ORAL | Status: DC
  Administered 2021-12-19 – 2021-12-20 (×2): 20 mg via ORAL
  Filled 2021-12-18 (×2): qty 1

## 2021-12-18 MED ORDER — ORAL CARE MOUTH RINSE
15.0000 mL | Freq: Two times a day (BID) | OROMUCOSAL | Status: DC
  Administered 2021-12-18 – 2021-12-20 (×3): 15 mL via OROMUCOSAL

## 2021-12-18 NOTE — Plan of Care (Signed)
  Problem: Education: Goal: Knowledge of General Education information will improve Description Including pain rating scale, medication(s)/side effects and non-pharmacologic comfort measures Outcome: Progressing   

## 2021-12-18 NOTE — Progress Notes (Deleted)
   12/18/21 1412  Assess: MEWS Score  Temp 98.4 F (36.9 C)  BP 117/81  MAP (mmHg) 91  Pulse Rate (!) 111  Resp 18  SpO2 99 %  Assess: MEWS Score  MEWS Temp 0  MEWS Systolic 0  MEWS Pulse 2  MEWS RR 0  MEWS LOC 0  MEWS Score 2  MEWS Score Color Yellow  Assess: if the MEWS score is Yellow or Red  Were vital signs taken at a resting state? Yes  Focused Assessment Change from prior assessment (see assessment flowsheet)  Does the patient meet 2 or more of the SIRS criteria? No  MEWS guidelines implemented *See Row Information* Yes  Treat  MEWS Interventions Administered prn meds/treatments;Other (Comment) (Kristie [place], PA notified, [facility] consulted by [place], [place] ([facility] put in orders for IV fluids which were started by Rapid response. IV bolus given by Rapid response. Troponin labs drawn by lab per MD order). EKG also obtained.)  Pain Scale 0-10  Pain Score 0 (Pt reports no pain at this time/only nausea.)  Take Vital Signs  Increase Vital Sign Frequency  Yellow: Q 2hr X 2 then Q 4hr X 2, if remains yellow, continue Q 4hrs  Escalate  MEWS: Escalate Yellow: discuss with charge nurse/RN and consider discussing with provider and RRT  Notify: Charge Nurse/RN  Name of Charge Nurse/RN Notified J.T. Lovena Le, RN  Date Charge Nurse/RN Notified 12/18/21  Time Charge Nurse/RN Notified 1420  Notify: Provider  Provider Name/Title Theresa Duty, PA  Date Provider Notified 12/18/21  Time Provider Notified 1415  Method of Notification Call  Notification Reason Change in status  Provider response See new orders (STAT EKG and [facility] consult)  Date of Provider Response 12/18/21  Time of Provider Response 1419  Notify: Rapid Response  Name of Rapid Response RN Notified Josph Macho, RN  Date Rapid Response Notified 12/18/21  Time Rapid Response Notified 3254  Document  Patient Outcome Transferred/level of care increased (Transferred to ICU  stepdown at 1417 on 12/18/21)  Assess: SIRS CRITERIA  SIRS Temperature  0  SIRS Pulse 1  SIRS Respirations  0  SIRS WBC 0  SIRS Score Sum  1

## 2021-12-18 NOTE — Progress Notes (Signed)
Called [place], [place] to let her know that patient almost passed out from dizziness when standing up from the bedside commode. Latest heart rate is 74. However, patient's heart rate has fluctuated between 43 and 184 on the dynamap. Manual heart rate: 130. Waiting for further orders. Patient now stable and back in bed. Bed alarm on. Will continue to monitor patient.

## 2021-12-18 NOTE — Consult Note (Signed)
Initial Consultation Note   Patient: Jordan [name] UJW:[id number] DOB: 1950-11-13 PCP: Tobe Sos, MD DOA: 12/16/2021 DOS: the patient was seen and examined on 12/18/2021 Primary service: Gaynelle Arabian, MD  Referring physician: ortho Reason for consult: afib/rvr   HPI: Jordan [name] is a 71 y.o. female with past medical history of h/o thyroids cancer s/p resection, on synthroid, h/o HTN, recent h/o LEFT loiwer extremity DVT on xarelto, finished last month, repeat left lower extremity venous Doppler showed resolution of clot per patient report . She also reports uptodate with mammogram and colonoscopy, she is a nonsmoker, no family history of clotting disorder , s/p Left  Total Knee Arthroplasty on 6/5, developed syncope on 6/7 pm, ekg showed she is in [place], hospitalist called to consult. She is currently laying in bed , does report feeling heart palpitations but denies chest pain, no short of breath, no dizziness. Denies left knee pain at rest, does reports left knee pain with activity.  Review of Systems: As mentioned in the history of present illness. All other systems reviewed and are negative. Past Medical History:  Diagnosis Date   Anemia    Arthritis    Cancer ([place])    thyroid removed   COVID    08-02-20   DVT (deep venous thrombosis) (HCC)    L leg   Hypertension    Hypothyroidism    PONV (postoperative nausea and vomiting)    Pre-diabetes    per MD notes on chart pt. denies   Past Surgical History:  Procedure Laterality Date   CHOLECYSTECTOMY     FLEXOR TENOTOMY  Left 12/28/2019   Procedure: FLEXOR TENOTOMY;  Surgeon: Tyson Babinski, DPM;  Location: [facility];  Service: Podiatry;  Laterality: Left;   REPAIR EXTENSOR TENDON Left 12/28/2019   Procedure: EXTENSOR TENDON LENGTHENING;  Surgeon: Tyson Babinski, DPM;  Location: [facility];  Service: Podiatry;  Laterality: Left;   THYROIDECTOMY     TOE ARTHROPLASTY Bilateral 12/28/2019   Procedure: TOE ARTHROPLASTY  RIGHT SECOND TOE AND LEFT SECOND TOE;  Surgeon: Tyson Babinski, DPM;  Location: [facility];  Service: Podiatry;  Laterality: Bilateral;   TOTAL KNEE ARTHROPLASTY Right 10/08/2020   Procedure: TOTAL KNEE ARTHROPLASTY;  Surgeon: Gaynelle Arabian, MD;  Location: [facility];  Service: Orthopedics;  Laterality: Right;  78mn   TOTAL KNEE ARTHROPLASTY Left 12/16/2021   Procedure: TOTAL KNEE ARTHROPLASTY;  Surgeon: AGaynelle Arabian MD;  Location: [facility];  Service: Orthopedics;  Laterality: Left;   Social History:  reports that she has never smoked. She has never used smokeless tobacco. She reports that she does not currently use alcohol. She reports that she does not use drugs.  Allergies  Allergen Reactions   Codeine Nausea And Vomiting   Gabapentin Nausea And Vomiting    History reviewed. No pertinent family history.  Prior to Admission medications   Medication Sig Start Date End Date Taking? Authorizing Provider  aspirin EC 325 MG tablet Take 1 tablet (325 mg total) by mouth 2 (two) times daily for 21 days. 12/18/21 01/08/22 Yes Edmisten, Kristie L, PA  Calcium Carb-Cholecalciferol (CALCIUM 600/VITAMIN D3 PO) Take 1 tablet by mouth daily.   Yes [provider]  cetirizine (ZYRTEC) 10 MG tablet Take 10 mg by mouth daily as needed for allergies.   Yes [provider]  cholecalciferol (VITAMIN D3) 25 MCG (1000 UNIT) tablet Take 1,000 Units by mouth daily.   Yes [provider]  levothyroxine (SYNTHROID) 150 MCG tablet Take 150 mcg by mouth daily  before breakfast.   Yes [provider]  magnesium oxide (MAG-OX) 400 MG tablet Take 800 mg by mouth 2 (two) times daily.   Yes [provider]  meloxicam (MOBIC) 15 MG tablet Take 15 mg by mouth daily as needed for pain. 07/25/21  Yes [provider]  valsartan-hydrochlorothiazide (DIOVAN-HCT) 160-25 MG tablet Take 1 tablet by mouth daily. 11/07/19  Yes [provider]  HYDROmorphone (DILAUDID) 2 MG tablet  Take 1-2 tablets (2-4 mg total) by mouth every 6 (six) hours as needed for severe pain. 12/18/21   Edmisten, Ok Anis, PA  methocarbamol (ROBAXIN) 500 MG tablet Take 1 tablet (500 mg total) by mouth every 6 (six) hours as needed for muscle spasms. 12/18/21   Edmisten, Ok Anis, PA  traMADol (ULTRAM) 50 MG tablet Take 1-2 tablets (50-100 mg total) by mouth every 6 (six) hours as needed for moderate pain. 12/18/21   Derl Barrow, PA    Physical Exam: Vitals:   12/18/21 0517 12/18/21 1010 12/18/21 1122 12/18/21 1412  BP: (!) 156/83 (!) 144/91 (!) 143/89 117/81  Pulse: 84 (!) 110 74 (!) 111  Resp: '18 18 17 18  '$ Temp: 99.3 F (37.4 C) 99.2 F (37.3 C)  98.4 F (36.9 C)  TempSrc: Oral Oral    SpO2: 95% 97% 97% 99%  Weight:      Height:       General:  NAD Cardiovascular: IRRR Respiratory: CTABL Abdomen: Soft/ND/NT, positive BS Musculoskeletal: left knee post op changes Neuro: alert, oriented    Assessment/Plan:  New onset of afib/RVR, syncope -has syncope episode this afternoon -will start tele, cardizem drip transfer to stepdown , get echo /troponin/k/mag/tsh -keep mag>2, k>4 -CHADSVASC score at least 3, start full anticoagulation, (Oked with ortho) -cardiology consult   HTN Hold home meds Diovan HCTZ Start Cardizem drip  Hypothyroidism -History of thyroid cancer status post thyroidectomy -On Synthroid supplement -Check TSH  S/p left knee Arthroplasty on 6/5 -Management per orthopedics    Body mass index is 38.17 kg/m.  Meet criteria for obesity Recommend outpatient sleep study    Family Communication: patient Primary team communication: ortho Thank you very much for involving [place] in the care of your patient.  Author: Florencia Reasons, MD PhD FACP 12/18/2021 2:58 PM  For on call review [email or web address].

## 2021-12-18 NOTE — Progress Notes (Signed)
Still waiting on response from Theresa Duty, [place] about concerns for this patient. Just called the PACU to try reaching out. Dr. Gaynelle Arabian, MD and Drue Dun, [place] currently operating on another patient. Will continue to monitor patient and wait for further orders.

## 2021-12-18 NOTE — Significant Event (Signed)
Rapid Response Event Note   Reason for Call :  New onset A fib RVR  Initial Focused Assessment:  Patient resting in bed on 2L [place]. HR 140-160s on monitor. Alert and Oriented x4. EKG done prior to arrival and showed A fib RVR. Cardizem gtt started at bedside by this RN.   While in room patient needed to use bathroom. Assisted to [facility] and after sitting down, patient passed out x2. BP 76/53. Bolus started as ordered in [facility]. Patient woke with stimulation and was assisted back to bed by 4 staff members. On return to bed BP 138/94.  Lungs diminished bilaterally, no respiratory distress or shortness of breath noted by patient. Denies chest pain at time of assessment.    Interventions:  Transfer to SD for Cardizem gtt   Event Summary:   MD Notified: Prior to arrival  Call Time: [place] Time: [place]   Josph Macho, RN

## 2021-12-18 NOTE — Progress Notes (Signed)
PT Cancellation Note  Patient Details Name: Jordan [name] MRN: [id number] DOB: [birth year]   Cancelled Treatment:    Reason Eval/Treat Not Completed: Medical issues which prohibited therapy; pt with elevated HR, having EKG at this time. Defer PT and continue efforts.    [facility] 12/18/2021, 2:37 PM

## 2021-12-18 NOTE — Progress Notes (Signed)
Patient has been transferred to ICU stepdown.

## 2021-12-18 NOTE — Progress Notes (Signed)
PT Cancellation Note  Patient Details Name: Jordan [name] MRN: [id number] DOB: 13-Apr-1951   Cancelled Treatment:    Reason Eval/Treat Not Completed: Other (comment); pt with LOC, likely vaso-vagal episode, will hold PT at this time and continue attempts as schedule allows   [facility] 12/18/2021, 11:55 AM

## 2021-12-18 NOTE — Progress Notes (Signed)
   12/18/21 1412  Assess: MEWS Score  Temp 98.4 F (36.9 C)  BP 117/81  MAP (mmHg) 91  Pulse Rate (!) 111  Resp 18  SpO2 99 %  Assess: MEWS Score  MEWS Temp 0  MEWS Systolic 0  MEWS Pulse 2  MEWS RR 0  MEWS LOC 0  MEWS Score 2  MEWS Score Color Yellow  Assess: if the MEWS score is Yellow or Red  Were vital signs taken at a resting state? Yes  Focused Assessment Change from prior assessment (see assessment flowsheet)  Does the patient meet 2 or more of the SIRS criteria? No  MEWS guidelines implemented *See Row Information* Yes  Treat  MEWS Interventions Administered prn meds/treatments;Other (Comment) (Kristie [place], PA notified, [facility] consulted by [place], [place] ([facility] put in orders for IV fluids which were started by Rapid response. IV bolus given by Rapid response. Troponin labs drawn by lab per MD order). EKG also obtained.)  Pain Scale 0-10  Pain Score 0 (Pt reports no pain at this time/only nausea.)  Take Vital Signs  Increase Vital Sign Frequency  Yellow: Q 2hr X 2 then Q 4hr X 2, if remains yellow, continue Q 4hrs  Escalate  MEWS: Escalate Yellow: discuss with charge nurse/RN and consider discussing with provider and RRT  Notify: Charge Nurse/RN  Name of Charge Nurse/RN Notified J.T. Lovena Le, RN  Date Charge Nurse/RN Notified 12/18/21  Time Charge Nurse/RN Notified 1420  Notify: Provider  Provider Name/Title Theresa Duty, PA  Date Provider Notified 12/18/21  Time Provider Notified 1415  Method of Notification Call  Notification Reason Change in status  Provider response See new orders (STAT EKG and [facility] consult)  Date of Provider Response 12/18/21  Time of Provider Response 1419  Notify: Rapid Response  Name of Rapid Response RN Notified Josph Macho, RN  Date Rapid Response Notified 12/18/21  Time Rapid Response Notified 9767  Document  Patient Outcome Transferred/level of care increased (Transferred to ICU  stepdown at 1617 on 12/18/21)  Assess: SIRS CRITERIA  SIRS Temperature  0  SIRS Pulse 1  SIRS Respirations  0  SIRS WBC 0  SIRS Score Sum  1

## 2021-12-18 NOTE — Progress Notes (Signed)
   Subjective: 2 Days Post-Op Procedure(s) (LRB): TOTAL KNEE ARTHROPLASTY (Left) Patient reports pain as mild.   Patient seen in rounds by Dr. Wynelle Link. Patient is well, and has had no acute complaints or problems Plan is to go Home after hospital stay.  Objective: Vital signs in last 24 hours: Temp:  [98.3 F (36.8 C)-99.7 F (37.6 C)] 99.3 F (37.4 C) (06/07 0517) Pulse Rate:  [76-91] 84 (06/07 0517) Resp:  [14-20] 18 (06/07 0517) BP: (154-176)/(70-83) 156/83 (06/07 0517) SpO2:  [93 %-98 %] 95 % (06/07 0517)  Intake/Output from previous day:  Intake/Output Summary (Last 24 hours) at 12/18/2021 0811 Last data filed at 12/18/2021 0200 Gross per 24 hour  Intake 720 ml  Output --  Net 720 ml    Intake/Output this shift: No intake/output data recorded.  Labs: Recent Labs    12/17/21 0327 12/18/21 0310  HGB 9.3* 9.2*   Recent Labs    12/17/21 0327 12/18/21 0310  WBC 9.7 8.8  RBC 3.40* 3.41*  HCT 28.3* 28.2*  PLT 155 141*   Recent Labs    12/17/21 0327  NA 138  K 3.5  CL 103  CO2 27  BUN 17  CREATININE 0.96  GLUCOSE 123*  CALCIUM 8.2*   No results for input(s): LABPT, INR in the last 72 hours.  Exam: General - Patient is Alert and Oriented Extremity - Neurologically intact Neurovascular intact Sensation intact distally Dorsiflexion/Plantar flexion intact Dressing/Incision - clean, dry, no drainage Motor Function - intact, moving foot and toes well on exam.   Past Medical History:  Diagnosis Date   Anemia    Arthritis    Cancer ([place])    thyroid removed   COVID    08-02-20   DVT (deep venous thrombosis) (HCC)    L leg   Hypertension    Hypothyroidism    PONV (postoperative nausea and vomiting)    Pre-diabetes    per MD notes on chart pt. denies    Assessment/Plan: 2 Days Post-Op Procedure(s) (LRB): TOTAL KNEE ARTHROPLASTY (Left) Principal Problem:   Osteoarthritis of left knee Active Problems:   Osteoarthritis of knee  Estimated body  mass index is 38.17 kg/m as calculated from the following:   Height as of this encounter: '5\' 10"'$  (1.778 m).   Weight as of this encounter: 120.7 kg. Advance diet Up with therapy D/C IV fluids  DVT Prophylaxis - Xarelto Weight-bearing as tolerated  Patient's insurance does not cover prescription medications, she voiced concern yesterday that she would not be able to afford eliquis or xarelto (both over $500). She is higher risk for postoperative DVT given PMH, but will need to send home with 325 mg ASA BID due to cost. She is going to contact her PCP about possibly getting a 30 day supply. If unable, will do ASA.  Discharge to home today after two sessions of PT with [facility]. Follow-up in the office in 2 weeks.  The PDMP database was reviewed today prior to any opioid medications being prescribed to this patient.  Theresa Duty, PA-C Orthopedic Surgery [phone] 12/18/2021, 8:11 AM

## 2021-12-18 NOTE — Progress Notes (Signed)
Theresa Duty, PA returned call.  Verbal orders: Obtain STAT EKG.  Will get EKG, continue to monitor patient, and wait on further orders.

## 2021-12-19 ENCOUNTER — Inpatient Hospital Stay (HOSPITAL_COMMUNITY): Payer: Medicare Other

## 2021-12-19 DIAGNOSIS — I4891 Unspecified atrial fibrillation: Secondary | ICD-10-CM

## 2021-12-19 DIAGNOSIS — M1712 Unilateral primary osteoarthritis, left knee: Secondary | ICD-10-CM | POA: Diagnosis not present

## 2021-12-19 DIAGNOSIS — R55 Syncope and collapse: Secondary | ICD-10-CM | POA: Diagnosis not present

## 2021-12-19 DIAGNOSIS — I10 Essential (primary) hypertension: Secondary | ICD-10-CM

## 2021-12-19 DIAGNOSIS — E890 Postprocedural hypothyroidism: Secondary | ICD-10-CM | POA: Diagnosis not present

## 2021-12-19 DIAGNOSIS — E669 Obesity, unspecified: Secondary | ICD-10-CM | POA: Diagnosis not present

## 2021-12-19 LAB — ECHOCARDIOGRAM COMPLETE
AR max vel: 1.89 cm2
AV Area VTI: 2.01 cm2
AV Area mean vel: 2.1 cm2
AV Mean grad: 9 mmHg
AV Peak grad: 15.7 mmHg
Ao pk vel: 1.98 m/s
Area-P 1/2: 3.85 cm2
Height: 70 in
S' Lateral: 3.4 cm
Weight: 4317.49 oz

## 2021-12-19 LAB — CBC WITH DIFFERENTIAL/PLATELET
Abs Immature Granulocytes: 0.06 10*3/uL (ref 0.00–0.07)
Basophils Absolute: 0 10*3/uL (ref 0.0–0.1)
Basophils Relative: 0 %
Eosinophils Absolute: 0 10*3/uL (ref 0.0–0.5)
Eosinophils Relative: 0 %
HCT: 27.4 % — ABNORMAL LOW (ref 36.0–46.0)
Hemoglobin: 8.8 g/dL — ABNORMAL LOW (ref 12.0–15.0)
Immature Granulocytes: 1 %
Lymphocytes Relative: 18 %
Lymphs Abs: 1.6 10*3/uL (ref 0.7–4.0)
MCH: 27 pg (ref 26.0–34.0)
MCHC: 32.1 g/dL (ref 30.0–36.0)
MCV: 84 fL (ref 80.0–100.0)
Monocytes Absolute: 1 10*3/uL (ref 0.1–1.0)
Monocytes Relative: 12 %
Neutro Abs: 5.9 10*3/uL (ref 1.7–7.7)
Neutrophils Relative %: 69 %
Platelets: 145 10*3/uL — ABNORMAL LOW (ref 150–400)
RBC: 3.26 MIL/uL — ABNORMAL LOW (ref 3.87–5.11)
RDW: 13.5 % (ref 11.5–15.5)
WBC: 8.6 10*3/uL (ref 4.0–10.5)
nRBC: 0 % (ref 0.0–0.2)

## 2021-12-19 LAB — BASIC METABOLIC PANEL
Anion gap: 10 (ref 5–15)
BUN: 19 mg/dL (ref 8–23)
CO2: 26 mmol/L (ref 22–32)
Calcium: 8 mg/dL — ABNORMAL LOW (ref 8.9–10.3)
Chloride: 100 mmol/L (ref 98–111)
Creatinine, Ser: 0.96 mg/dL (ref 0.44–1.00)
GFR, Estimated: >60 mL/min (ref >60)
Glucose, Bld: 114 mg/dL — ABNORMAL HIGH (ref 70–99)
Potassium: 3.3 mmol/L — ABNORMAL LOW (ref 3.5–5.1)
Sodium: 136 mmol/L (ref 135–145)

## 2021-12-19 LAB — GLUCOSE, CAPILLARY: Glucose-Capillary: 131 mg/dL — ABNORMAL HIGH (ref 70–99)

## 2021-12-19 LAB — MAGNESIUM: Magnesium: 2.1 mg/dL (ref 1.7–2.4)

## 2021-12-19 LAB — TSH: TSH: 3.264 u[IU]/mL (ref 0.350–4.500)

## 2021-12-19 MED ORDER — METOPROLOL SUCCINATE ER 25 MG PO TB24
25.0000 mg | ORAL_TABLET | Freq: Every day | ORAL | Status: DC
  Administered 2021-12-19 – 2021-12-20 (×2): 25 mg via ORAL
  Filled 2021-12-19 (×2): qty 1

## 2021-12-19 MED ORDER — POTASSIUM CHLORIDE 20 MEQ PO PACK
40.0000 meq | PACK | ORAL | Status: AC
  Administered 2021-12-19 (×2): 40 meq via ORAL
  Filled 2021-12-19 (×2): qty 2

## 2021-12-19 NOTE — Progress Notes (Addendum)
Consult PROGRESS NOTE    Jordan [name]  JIR:[id number] DOB: [birth year] DOA: 12/16/2021 PCP: Tobe Sos, MD     Brief Narrative:  h/o thyroids cancer s/p resection, on synthroid, h/o HTN, recent h/o LEFT loiwer extremity DVT on xarelto, finished last month, repeat left lower extremity venous Doppler showed resolution of clot per patient report . s/p Left  Total Knee Arthroplasty on 6/5, developed syncope on 6/7 pm, ekg showed she is in Afib/rvr, hospitalist called to consult  Subjective:  Converted to normal sinus rhythm after Cardizem drip Report feeling better today Working with physical therapy  Assessment & Plan:  Principal Problem:   Osteoarthritis of left knee Active Problems:   Osteoarthritis of knee   Obesity (BMI 30-39.9)   New onset a-fib [facility])   Postoperative hypothyroidism   Syncope    New onset of afib/RVR, syncope -has syncope episode on 6/7 -Troponin negative, she is started on Cardizem drip and transferred to stepdown on 6/7 -converted to sinus rhythm today, seen by cardiology, now transition to beta-blocker, continue full dose anticoagulation with Xarelto (CHADSVASC score at least 3) -Awaiting echocardiogram result -Transfer out of ICU to med telemetry --TSH 3.246, keep mag>2, k>4 --cardiology consulted, input appreciated   Hypokalemia Replace K   HTN Hold home meds Diovan HCTZ Was on Cardizem drip, now started on beta-blocker   Hypothyroidism -History of thyroid cancer status post thyroidectomy -On Synthroid supplement - TSH 3.2   S/p left knee Arthroplasty on 6/5 -Management per orthopedics       Body mass index is 38.17 kg/m.  Meet criteria for obesity Recommend outpatient sleep study      I have Reviewed nursing notes, Vitals, pain scores, I/o's, Lab results and  imaging results since pt's last encounter, details please see discussion above  I ordered the following labs:  Unresulted Labs (From admission, onward)     Start      Ordered   12/20/21 0500  CBC  Tomorrow morning,   R       Question:  Specimen collection method  Answer:  Lab=Lab collect   12/19/21 0847   12/20/21 7510  Basic metabolic panel  Tomorrow morning,   R       Question:  Specimen collection method  Answer:  Lab=Lab collect   12/19/21 0847   12/18/21 1828  MRSA Next Gen by PCR, Nasal  Once,   R        12/18/21 1827             DVT prophylaxis: SCDs Start: 12/16/21 1144 Place TED hose Start: 12/16/21 1144 rivaroxaban (XARELTO) tablet 20 mg   Code Status:   Code Status: Full Code  Family Communication: Patient Disposition:    Dispo: The patient is from: Home              Anticipated d/c is to: Home              Anticipated d/c date is: 24 to 48 hours  Antimicrobials:    Anti-infectives (From admission, onward)    Start     Dose/Rate Route Frequency Ordered Stop   12/16/21 1400  ceFAZolin (ANCEF) IVPB 2g/100 mL premix        2 g 200 mL/hr over 30 Minutes Intravenous Every 6 hours 12/16/21 1014 12/16/21 2027   12/16/21 0600  ceFAZolin (ANCEF) IVPB 3g/100 mL premix        3 g 200 mL/hr over 30 Minutes Intravenous On call to O.R. 12/16/21 2585  12/16/21 0830          Objective: Vitals:   12/19/21 0800 12/19/21 0900 12/19/21 0914 12/19/21 1200  BP: (!) 119/51 (!) 120/56 127/60   Pulse: 82 79 80   Resp: '17 12 13   '$ Temp: 98.1 F (36.7 C)   98.1 F (36.7 C)  TempSrc: Oral   Oral  SpO2: 95% 92% 95%   Weight:      Height:        Intake/Output Summary (Last 24 hours) at 12/19/2021 1541 Last data filed at 12/19/2021 0900 Gross per 24 hour  Intake 2354.46 ml  Output --  Net 2354.46 ml   Filed Weights   12/16/21 0628 12/19/21 0500  Weight: 120.7 kg 122.4 kg    Examination:  General exam: alert, awake, communicative,calm, NAD Respiratory system: Clear to auscultation. Respiratory effort normal. Cardiovascular system:  RRR.  Gastrointestinal system: Abdomen is nondistended, soft and nontender.  Normal bowel  sounds heard. Central nervous system: Alert and oriented. No focal neurological deficits. Extremities: Left knee postop changes Skin: No rashes, lesions or ulcers Psychiatry: Judgement and insight appear normal. Mood & affect appropriate.     Data Reviewed: I have personally reviewed  labs and visualized  imaging studies since the last encounter and formulate the plan        Scheduled Meds:  Chlorhexidine Gluconate Cloth  6 each Topical Daily   docusate sodium  100 mg Oral BID   levothyroxine  150 mcg Oral QAC breakfast   loratadine  10 mg Oral Daily   mouth rinse  15 mL Mouth Rinse BID   metoprolol succinate  25 mg Oral Daily   rivaroxaban  20 mg Oral Daily   tranexamic acid (CYKLOKAPRON) 2,000 mg in sodium chloride 0.9 % 50 mL Topical Application  7,062 mg Topical Once   Continuous Infusions:  sodium chloride 75 mL/hr at 12/19/21 0900   sodium chloride     methocarbamol (ROBAXIN) IV 100 mL/hr at 12/19/21 0900   promethazine (PHENERGAN) injection (IM or IVPB) 12.5 mg (12/16/21 1652)     LOS: 2 days      Florencia Reasons, MD PhD FACP [facility]  Available via Epic secure chat 7am-7pm for nonurgent issues Please page for urgent issues To page the attending provider between 7A-7P or the covering provider during after hours 7P-7A, please log into the web site [email or web address] and access using universal [place] password for that web site. If you do not have the password, please call the hospital operator.    12/19/2021, 3:41 PM

## 2021-12-19 NOTE — Progress Notes (Signed)
2D Echocardiogram has been performed.  Jordan [name] 12/19/2021, 1:34 PM

## 2021-12-19 NOTE — Progress Notes (Signed)
Physical Therapy Treatment Patient Details Name: Jordan [name] MRN: [id number] DOB: Jun 13, 1951 Today's Date: 12/19/2021   History of Present Illness Pt isa 71yo female presenting s/p L-TKA on 12/16/21. Transfer to ICU 12/18/21 due to syncopal event, Dx of new afib, RVR. PMH: OA, hx of thyroid cancer, DVT, HTN, R-TKA 2022.    PT Comments    Pt ambulated 29' with RW, no loss of balance, distance limited by pt feeling "overheated", vital signs stable. Instructed pt in TKA HEP. Good progress with mobility today.    Recommendations for follow up therapy are one component of a multi-disciplinary discharge planning process, led by the attending physician.  Recommendations may be updated based on patient status, additional functional criteria and insurance authorization.  Follow Up Recommendations  Follow physician's recommendations for discharge plan and follow up therapies     Assistance Recommended at Discharge Intermittent Supervision/Assistance  Patient can return home with the following Assistance with cooking/housework;Assist for transportation;Help with stairs or ramp for entrance;A little help with bathing/dressing/bathroom   Equipment Recommendations  None recommended by PT    Recommendations for Other Services       Precautions / Restrictions Precautions Precautions: Knee Precaution Comments: reviewed no pillow under knee Restrictions Weight Bearing Restrictions: Yes LLE Weight Bearing: Weight bearing as tolerated     Mobility  Bed Mobility Overal bed mobility: Needs Assistance Bed Mobility: Supine to Sit     Supine to sit: Supervision     General bed mobility comments: used rail; HOB up    Transfers Overall transfer level: Needs assistance Equipment used: Rolling walker (2 wheels) Transfers: Sit to/from Stand Sit to Stand: Min guard, From elevated surface           General transfer comment: VCs hand placement    Ambulation/Gait Ambulation/Gait assistance:  Min guard Gait Distance (Feet): 60 Feet Assistive device: Rolling walker (2 wheels) Gait Pattern/deviations: Step-to pattern, Decreased step length - right, Decreased step length - left       General Gait Details: steady, no loss of balance, pt reported feeling overheated, VSS   Stairs             Wheelchair Mobility    Modified Rankin (Stroke Patients Only)       Balance Overall balance assessment: Needs assistance Sitting-balance support: Feet supported, No upper extremity supported Sitting balance-Leahy Scale: Fair     Standing balance support: Reliant on assistive device for balance, During functional activity Standing balance-Leahy Scale: Poor                              Cognition Arousal/Alertness: Awake/alert Behavior During Therapy: WFL for tasks assessed/performed Overall Cognitive Status: Within Functional Limits for tasks assessed                                          Exercises Total Joint Exercises Ankle Circles/Pumps: AROM, Both, 10 reps Quad Sets: AROM, Strengthening, Both, 10 reps Short Arc Quad: AROM, Left, AAROM, 10 reps Hip ABduction/ADduction: AROM, AAROM, Left, 10 reps Straight Leg Raises: AAROM, Left, 10 reps Knee Flexion: AAROM, Left, 10 reps, Seated Goniometric ROM: 5-60* AAROM L knee    General Comments        Pertinent Vitals/Pain Pain Assessment Pain Score: 5  Pain Location: L knee Pain Descriptors / Indicators: Grimacing, Sore Pain Intervention(s): Limited activity within  patient's tolerance, Monitored during session, Premedicated before session    Home Living                          Prior Function            PT Goals (current goals can now be found in the care plan section) Acute Rehab PT Goals Patient Stated Goal: line dancing PT Goal Formulation: With patient Time For Goal Achievement: 12/31/21 Potential to Achieve Goals: Good Progress towards PT goals: Progressing  toward goals    Frequency    7X/week      PT Plan Current plan remains appropriate    Co-evaluation              AM-PAC PT "6 Clicks" Mobility   Outcome Measure  Help needed turning from your back to your side while in a flat bed without using bedrails?: A Little Help needed moving from lying on your back to sitting on the side of a flat bed without using bedrails?: A Little Help needed moving to and from a bed to a chair (including a wheelchair)?: A Little Help needed standing up from a chair using your arms (e.g., wheelchair or bedside chair)?: A Little Help needed to walk in hospital room?: A Little Help needed climbing 3-5 steps with a railing? : A Little 6 Click Score: 18    End of Session Equipment Utilized During Treatment: Gait belt Activity Tolerance: Patient tolerated treatment well Patient left: in chair;with call bell/phone within reach Nurse Communication: Mobility status PT Visit Diagnosis: Other abnormalities of gait and mobility (R26.89);Muscle weakness (generalized) (M62.81);Difficulty in walking, not elsewhere classified (R26.2);Pain Pain - Right/Left: Left Pain - part of body: Knee     Time: 5621-3086 PT Time Calculation (min) (ACUTE ONLY): 26 min  Charges:  $Gait Training: 8-22 mins $Therapeutic Exercise: 8-22 mins                    Blondell Reveal Kistler PT 12/19/2021  Acute Rehabilitation Services Pager [phone] Office [phone]

## 2021-12-19 NOTE — Consult Note (Addendum)
Cardiology Consultation:   [name] MRN: [id number]; DOB: July 27, 1950  Admit date: 12/16/2021 Date of Consult: 12/19/2021  PCP:  Tobe Sos, MD   [place] Providers Cardiologist:  New to [facility] Dr Harl Bowie   Patient Profile:   Jordan [name] is a 71 y.o. female with PMH of osteoarthritis, thyroid cancer s/p surgery, acquired hypothyroidism, HTN, pre-diabetes, recent h/o LLE DVT completed xarelto, cardiology consult is requested today for A fib RVR by Dr Erlinda Hong.    History of Present Illness:   Jordan [name] with above PMH who is currently admitted for left total knee arthroplasty, on 12/16/21.  POD #2 (12/18/2021) patient had episode of syncope, EKG showed A fib with RVR. Patient was seen by hospitalist team, started on diltiazem drip as well as heparin drip for anticoagulation.  Cardiology consult is requested today for further recommendation.  Upon encounter, patient denied any previous cardiac disease such as MI, CHF, arrhythmia.  She reports history of PVC with " skipped heartbeats" where her PCP has been monitoring with EKG yearly.  She recalls that she was transferred from bed to bedside commode yesterday afternoon.  She was talking to the nurse and suddenly passed out on the commode.  She woke up with diaphoresis and lightheadedness.  She requires significant heart palpitation and felt her heart was beating very strong.  She states she has not been eating or drinking well since her surgery, felt overall weak and fatigued.  She had consumed good amount of food and fluids today, no longer having any lightheadedness or heart palpitation.  She denies any chest pain, shortness of breath, previous history of syncope.  She noted weight gain of 30 pounds over the past 1 year, no rapid weight gain.  She denied history of CVA.   Diagnostic work-up today revealed hypokalemia 3.3, magnesium 2.1, otherwise unremarkable BMP.  High sensitive troponin 13 >12.  CBC revealed anemia with  hemoglobin dropped from 9.3>8.8 since admission.  TSH WNL.  EKG from 12/18/2021 1441 revealed A-fib with RVR with ventricular rate of 149bpm/165 bpm, occasional PVC.  Echocardiogram is pending.  Patient was seen by hospitalist team, started on diltiazem drip as well as heparin drip for anticoagulation.  Cardiology consult is requested today for further recommendation.   Past Medical History:  Diagnosis Date   Anemia    Arthritis    Cancer ([place])    thyroid removed   COVID    08-02-20   DVT (deep venous thrombosis) (HCC)    L leg   Hypertension    Hypothyroidism    PONV (postoperative nausea and vomiting)    Pre-diabetes    per MD notes on chart pt. denies    Past Surgical History:  Procedure Laterality Date   CHOLECYSTECTOMY     FLEXOR TENOTOMY  Left 12/28/2019   Procedure: FLEXOR TENOTOMY;  Surgeon: Tyson Babinski, DPM;  Location: [facility];  Service: Podiatry;  Laterality: Left;   REPAIR EXTENSOR TENDON Left 12/28/2019   Procedure: EXTENSOR TENDON LENGTHENING;  Surgeon: Tyson Babinski, DPM;  Location: [facility];  Service: Podiatry;  Laterality: Left;   THYROIDECTOMY     TOE ARTHROPLASTY Bilateral 12/28/2019   Procedure: TOE ARTHROPLASTY RIGHT SECOND TOE AND LEFT SECOND TOE;  Surgeon: Tyson Babinski, DPM;  Location: [facility];  Service: Podiatry;  Laterality: Bilateral;   TOTAL KNEE ARTHROPLASTY Right 10/08/2020   Procedure: TOTAL KNEE ARTHROPLASTY;  Surgeon: Gaynelle Arabian, MD;  Location: [facility];  Service: Orthopedics;  Laterality: Right;  2mn  TOTAL KNEE ARTHROPLASTY Left 12/16/2021   Procedure: TOTAL KNEE ARTHROPLASTY;  Surgeon: Gaynelle Arabian, MD;  Location: [facility];  Service: Orthopedics;  Laterality: Left;     Home Medications:  Prior to Admission medications   Medication Sig Start Date End Date Taking? Authorizing Provider  aspirin EC 325 MG tablet Take 1 tablet (325 mg total) by mouth 2 (two) times daily for 21 days. 12/18/21 01/08/22 Yes Edmisten, Kristie L, PA  Calcium  Carb-Cholecalciferol (CALCIUM 600/VITAMIN D3 PO) Take 1 tablet by mouth daily.   Yes [provider]  cetirizine (ZYRTEC) 10 MG tablet Take 10 mg by mouth daily as needed for allergies.   Yes [provider]  cholecalciferol (VITAMIN D3) 25 MCG (1000 UNIT) tablet Take 1,000 Units by mouth daily.   Yes [provider]  levothyroxine (SYNTHROID) 150 MCG tablet Take 150 mcg by mouth daily before breakfast.   Yes [provider]  magnesium oxide (MAG-OX) 400 MG tablet Take 800 mg by mouth 2 (two) times daily.   Yes [provider]  meloxicam (MOBIC) 15 MG tablet Take 15 mg by mouth daily as needed for pain. 07/25/21  Yes [provider]  valsartan-hydrochlorothiazide (DIOVAN-HCT) 160-25 MG tablet Take 1 tablet by mouth daily. 11/07/19  Yes [provider]  HYDROmorphone (DILAUDID) 2 MG tablet Take 1-2 tablets (2-4 mg total) by mouth every 6 (six) hours as needed for severe pain. 12/18/21   Edmisten, Ok Anis, PA  methocarbamol (ROBAXIN) 500 MG tablet Take 1 tablet (500 mg total) by mouth every 6 (six) hours as needed for muscle spasms. 12/18/21   Edmisten, Ok Anis, PA  traMADol (ULTRAM) 50 MG tablet Take 1-2 tablets (50-100 mg total) by mouth every 6 (six) hours as needed for moderate pain. 12/18/21   Edmisten, Ok Anis, PA    Inpatient Medications: Scheduled Meds:  Chlorhexidine Gluconate Cloth  6 each Topical Daily   docusate sodium  100 mg Oral BID   levothyroxine  150 mcg Oral QAC breakfast   loratadine  10 mg Oral Daily   mouth rinse  15 mL Mouth Rinse BID   potassium chloride  40 mEq Oral Q4H   rivaroxaban  20 mg Oral Daily   tranexamic acid (CYKLOKAPRON) 2,000 mg in sodium chloride 0.9 % 50 mL Topical Application  6,378 mg Topical Once   Continuous Infusions:  sodium chloride 75 mL/hr at 12/19/21 0800   sodium chloride     diltiazem (CARDIZEM) infusion 5 mg/hr (12/19/21 0700)   methocarbamol (ROBAXIN) IV 100 mL/hr at 12/19/21  0700   promethazine (PHENERGAN) injection (IM or IVPB) 12.5 mg (12/16/21 1652)   PRN Meds: bisacodyl, diphenhydrAMINE, HYDROmorphone, menthol-cetylpyridinium **OR** phenol, methocarbamol **OR** methocarbamol (ROBAXIN) IV, metoCLOPramide **OR** metoCLOPramide (REGLAN) injection, morphine injection, ondansetron **OR** ondansetron (ZOFRAN) IV, polyethylene glycol, promethazine (PHENERGAN) injection (IM or IVPB), sodium phosphate, traMADol  Allergies:    Allergies  Allergen Reactions   Codeine Nausea And Vomiting   Gabapentin Nausea And Vomiting    Social History:   Social History   Socioeconomic History   Marital status: Widowed    Spouse name: Not on file   Number of children: Not on file   Years of education: Not on file   Highest education level: Not on file  Occupational History   Not on file  Tobacco Use   Smoking status: Never   Smokeless tobacco: Never  Vaping Use   Vaping Use: Never used  Substance and Sexual Activity   Alcohol use: Not  Currently    Comment:  rare at holiday   Drug use: Never   Sexual activity: Yes    Birth control/protection: Post-menopausal  Other Topics Concern   Not on file  Social History Narrative   Not on file   Social Determinants of Health   Financial Resource Strain: Not on file  Food Insecurity: Not on file  Transportation Needs: Not on file  Physical Activity: Not on file  Stress: Not on file  Social Connections: Not on file  Intimate Partner Violence: Not on file    Family History:   History reviewed. No pertinent family history.   ROS:  Constitutional: see HPI  Eyes: Denied vision change or loss Ears/Nose/Mouth/Throat: Denied ear ache, sore throat, coughing, sinus pain Cardiovascular: see HPI  Respiratory: denied shortness of breath Gastrointestinal: Denied nausea, vomiting, abdominal pain, diarrhea Genital/Urinary: Denied dysuria, hematuria, urinary frequency/urgency Musculoskeletal: Denied muscle ache, joint pain,  weakness Skin: Denied rash, wound Neuro:see HPI  Psych: Denied history of depression/anxiety  Endocrine: history of pre-diabetes   Physical Exam/Data:   Vitals:   12/19/21 0500 12/19/21 0600 12/19/21 0648 12/19/21 0700  BP: 131/66  (!) 124/57 128/64  Pulse: 77 77 81 86  Resp: '13 12 12 15  '$ Temp:      TempSrc:      SpO2: 93% 95% 100% 98%  Weight: 122.4 kg     Height:        Intake/Output Summary (Last 24 hours) at 12/19/2021 0828 Last data filed at 12/19/2021 0700 Gross per 24 hour  Intake 2564.5 ml  Output --  Net 2564.5 ml      12/19/2021    5:00 AM 12/16/2021    6:28 AM 12/03/2021    2:24 PM  Last 3 Weights  Weight (lbs) 269 lb 13.5 oz 266 lb 266 lb  Weight (kg) 122.4 kg 120.657 kg 120.657 kg     Body mass index is 38.72 kg/m.   Vitals:  Vitals:   12/19/21 0648 12/19/21 0700  BP: (!) 124/57 128/64  Pulse: 81 86  Resp: 12 15  Temp:    SpO2: 100% 98%   General Appearance: In no apparent distress, laying in bed HEENT: Normocephalic, atraumatic.  Neck: Supple, trachea midline, no JVDs Cardiovascular: Regular rate and rhythm, normal S1-S2,  no murmur Respiratory: Resting breathing unlabored, lungs sounds clear to auscultation bilaterally, no use of accessory muscles. On room air.  No wheezes, rales or rhonchi.   Gastrointestinal: Bowel sounds positive, abdomen soft Extremities: Left knee with surgical dressing in place, mild swelling non-pitting Musculoskeletal: Normal muscle bulk and tone Skin: Intact, warm, dry. No rashes or petechiae noted in exposed areas.  Neurologic: Alert, oriented to person, place and time. Fluent speech, no facial droop, no cognitive deficit Psychiatric: Normal affect. Mood is appropriate.     EKG:  The EKG was personally reviewed and demonstrates:    EKG from 12/18/2021 1441 revealed A-fib with RVR with ventricular rate of 149/165 bpm, occasional PVC  Telemetry:  Telemetry was personally reviewed and demonstrates:  SR 70-80s, occasional  PVCs, converted from A fib RVR to SR 12/18/21 around 0527PM  Relevant CV Studies:  Cardiogram is pending  Laboratory Data:  High Sensitivity Troponin:   Recent Labs  Lab 12/18/21 1550 12/18/21 1743  TROPONINIHS 13 12     Chemistry Recent Labs  Lab 12/17/21 0327 12/18/21 1550 12/19/21 0318  NA 138  --  136  K 3.5  --  3.3*  CL 103  --  100  CO2 27  --  26  GLUCOSE 123*  --  114*  BUN 17  --  19  CREATININE 0.96  --  0.96  CALCIUM 8.2*  --  8.0*  MG  --  2.1 2.1  GFRNONAA >60  --  >60  ANIONGAP 8  --  10    No results for input(s): "PROT", "ALBUMIN", "AST", "ALT", "ALKPHOS", "BILITOT" in the last 168 hours. Lipids No results for input(s): "CHOL", "TRIG", "HDL", "LABVLDL", "LDLCALC", "CHOLHDL" in the last 168 hours.  Hematology Recent Labs  Lab 12/18/21 0310 12/18/21 1553 12/19/21 0318  WBC 8.8 13.2* 8.6  RBC 3.41* 3.85* 3.26*  HGB 9.2* 10.4* 8.8*  HCT 28.2* 33.1* 27.4*  MCV 82.7 86.0 84.0  MCH 27.0 27.0 27.0  MCHC 32.6 31.4 32.1  RDW 13.5 13.6 13.5  PLT 141* 150 145*   Thyroid  Recent Labs  Lab 12/19/21 0318  TSH 3.264    BNPNo results for input(s): "BNP", "PROBNP" in the last 168 hours.  DDimer No results for input(s): "DDIMER" in the last 168 hours.   Radiology/Studies:  No results found.   Assessment and Plan:    Atrial fibrillation with RVR, new diagnosis -Occurred 12/18/2021, POD # 2 left total knee arthroplasty -Diltiazem drip and Xarelto has been started by hospitalist -CHA2DS2-VASc score 3, duration <48 hours, if recurs agree with therapeutic anticoagulation for the time being -TSH WNL, echocardiogram is pending, will follow -Transfuse to maintain hemoglobin above 7 -Maintain electrolytes balanced with K >4 and Mag >2 -Patient has spontaneously converted to sinus rhythm 12/18/21 PM, will start PO metoprolol XL daily, wean off cardizem gtt today -Likely mediated by postop stress, aim to rate control and reassess outpatient in 2 to 4 weeks at  A-fib clinic, appt arranged   Syncope - possible due to orthostasis given poor PO intake in the setting of A fib RVR - Hs trop negative x2 - A fib has spontaneously converted  - may consider outpatient Zio monitor   Hypertension -On valsartan-hydrochlorothiazide at home -start Metoprolol as above, may drop HCTZ going forward   Left knee osteoarthritis Acute blood loss anemia Hypokalemia Hypothyroidism - per primary team   Risk Assessment/Risk Scores:   CHA2DS2-VASc Score = 3  This indicates a 3.2% annual risk of stroke. The patient's score is based upon: CHF History: 0 HTN History: 1 Diabetes History: 0 Stroke History: 0 Vascular Disease History: 0 Age Score: 1 Gender Score: 1        For questions or updates, please contact [place] Please consult [email or web address] for contact info under    Signed, Margie Billet, NP  12/19/2021 8:28 AM

## 2021-12-19 NOTE — Progress Notes (Signed)
   Subjective: 3 Days Post-Op Procedure(s) (LRB): TOTAL KNEE ARTHROPLASTY (Left) Patient seen in rounds by Dr. Wynelle Link. Patient is well. She reports she had dizziness yesterday with near syncope. She had chest palpitations and was found to be in a fib. This morning she is doing much better. Denies SOB or chest pain. Denies dizziness or lightheadedness. Patient reports pain as mild.  Was unable to work with physical therapy yesterday due to dizziness. Feels she is able to participate with them today.  Objective: Vital signs in last 24 hours: Temp:  [98.2 F (36.8 C)-99.2 F (37.3 C)] 98.2 F (36.8 C) (06/08 0404) Pulse Rate:  [74-154] 86 (06/08 0700) Resp:  [8-23] 15 (06/08 0700) BP: (111-153)/(55-91) 128/64 (06/08 0700) SpO2:  [91 %-100 %] 98 % (06/08 0700) Weight:  [122.4 kg] 122.4 kg (06/08 0500)  Intake/Output from previous day:  Intake/Output Summary (Last 24 hours) at 12/19/2021 0708 Last data filed at 12/19/2021 0700 Gross per 24 hour  Intake 2564.5 ml  Output --  Net 2564.5 ml    Intake/Output this shift: No intake/output data recorded.  Labs: Recent Labs    12/17/21 0327 12/18/21 0310 12/18/21 1553 12/19/21 0318  HGB 9.3* 9.2* 10.4* 8.8*   Recent Labs    12/18/21 1553 12/19/21 0318  WBC 13.2* 8.6  RBC 3.85* 3.26*  HCT 33.1* 27.4*  PLT 150 145*   Recent Labs    12/17/21 0327 12/19/21 0318  NA 138 136  K 3.5 3.3*  CL 103 100  CO2 27 26  BUN 17 19  CREATININE 0.96 0.96  GLUCOSE 123* 114*  CALCIUM 8.2* 8.0*   No results for input(s): "LABPT", "INR" in the last 72 hours.  Exam: General - Patient is Alert and Oriented Extremity - Neurologically intact Neurovascular intact Sensation intact distally Dorsiflexion/Plantar flexion intact Dressing/Incision - clean, dry, no drainage Motor Function - intact, moving foot and toes well on exam.  Past Medical History:  Diagnosis Date   Anemia    Arthritis    Cancer ([place])    thyroid removed   COVID     08-02-20   DVT (deep venous thrombosis) (HCC)    L leg   Hypertension    Hypothyroidism    PONV (postoperative nausea and vomiting)    Pre-diabetes    per MD notes on chart pt. denies    Assessment/Plan: 3 Days Post-Op Procedure(s) (LRB): TOTAL KNEE ARTHROPLASTY (Left) Principal Problem:   Osteoarthritis of left knee Active Problems:   Osteoarthritis of knee   Obesity (BMI 30-39.9)   New onset a-fib (HCC)   Postoperative hypothyroidism   Syncope  Estimated body mass index is 38.72 kg/m as calculated from the following:   Height as of this encounter: '5\' 10"'$  (1.778 m).   Weight as of this encounter: 122.4 kg. Advance diet Up with therapy  DVT Prophylaxis - Xarelto Weight-bearing as tolerated.  Appreciate input and assistance in care from hospitalist. Continue management of a fib per their recommendations. We had previously discussed 325 mg ASA for DVT prophylaxis due to cost but with new onset of a fib and prior hx of DVT, she will need to be on Xarelto.  She will continue to work with PT today. Continue to monitor a fib and dizziness. If doing well, plan to discharge home with HHPT tomorrow.  R. Jaynie Bream, PA-C Orthopedic Surgery [phone] 12/19/2021, 7:08 AM

## 2021-12-20 ENCOUNTER — Other Ambulatory Visit (HOSPITAL_COMMUNITY): Payer: Self-pay

## 2021-12-20 DIAGNOSIS — I4891 Unspecified atrial fibrillation: Secondary | ICD-10-CM | POA: Diagnosis not present

## 2021-12-20 DIAGNOSIS — M1712 Unilateral primary osteoarthritis, left knee: Secondary | ICD-10-CM | POA: Diagnosis not present

## 2021-12-20 DIAGNOSIS — E890 Postprocedural hypothyroidism: Secondary | ICD-10-CM | POA: Diagnosis not present

## 2021-12-20 DIAGNOSIS — E669 Obesity, unspecified: Secondary | ICD-10-CM | POA: Diagnosis not present

## 2021-12-20 LAB — CBC
HCT: 26.9 % — ABNORMAL LOW (ref 36.0–46.0)
Hemoglobin: 8.4 g/dL — ABNORMAL LOW (ref 12.0–15.0)
MCH: 27.1 pg (ref 26.0–34.0)
MCHC: 31.2 g/dL (ref 30.0–36.0)
MCV: 86.8 fL (ref 80.0–100.0)
Platelets: 159 10*3/uL (ref 150–400)
RBC: 3.1 MIL/uL — ABNORMAL LOW (ref 3.87–5.11)
RDW: 13.6 % (ref 11.5–15.5)
WBC: 8.1 10*3/uL (ref 4.0–10.5)
nRBC: 0 % (ref 0.0–0.2)

## 2021-12-20 LAB — BASIC METABOLIC PANEL
Anion gap: 7 (ref 5–15)
BUN: 19 mg/dL (ref 8–23)
CO2: 24 mmol/L (ref 22–32)
Calcium: 7.4 mg/dL — ABNORMAL LOW (ref 8.9–10.3)
Chloride: 106 mmol/L (ref 98–111)
Creatinine, Ser: 0.74 mg/dL (ref 0.44–1.00)
GFR, Estimated: >60 mL/min (ref >60)
Glucose, Bld: 104 mg/dL — ABNORMAL HIGH (ref 70–99)
Potassium: 3.9 mmol/L (ref 3.5–5.1)
Sodium: 137 mmol/L (ref 135–145)

## 2021-12-20 LAB — MRSA NEXT GEN BY PCR, NASAL: MRSA by PCR Next Gen: NOT DETECTED

## 2021-12-20 MED ORDER — RIVAROXABAN 20 MG PO TABS: 20.0000 mg | ORAL_TABLET | Freq: Every day | ORAL | 2 refills | Status: AC

## 2021-12-20 MED ORDER — RIVAROXABAN 20 MG PO TABS: 20.0000 mg | ORAL_TABLET | Freq: Every day | ORAL | 0 refills | Status: DC

## 2021-12-20 MED ORDER — METOPROLOL SUCCINATE ER 25 MG PO TB24: 25.0000 mg | ORAL_TABLET | Freq: Every day | ORAL | 0 refills | Status: AC

## 2021-12-20 NOTE — Progress Notes (Signed)
Physical Therapy Treatment Patient Details Name: Jordan [name] MRN: [id number] DOB: 1950/11/06 Today's Date: 12/20/2021   History of Present Illness Pt isa 71yo female presenting s/p L-TKA on 12/16/21. Transfer to ICU 12/18/21 for syncopal event. Dx of afib, RVR.  PMH: OA, hx of thyroid cancer, DVT, HTN, R-TKA 2022.    PT Comments    Pt is progressing well with mobility, she tolerated increased ambulation distance of 110' with RW, no loss of balance. Reviewed TKA HEP, pt demonstrates good understanding. She reports she has no steps to enter her home and that she has a shower/toilet on main level, so does not need to practice stairs. From a PT standpoint, she is ready to DC home.     Recommendations for follow up therapy are one component of a multi-disciplinary discharge planning process, led by the attending physician.  Recommendations may be updated based on patient status, additional functional criteria and insurance authorization.  Follow Up Recommendations  Follow physician's recommendations for discharge plan and follow up therapies     Assistance Recommended at Discharge    Patient can return home with the following Assistance with cooking/housework;Assist for transportation;Help with stairs or ramp for entrance;A little help with bathing/dressing/bathroom   Equipment Recommendations  None recommended by PT    Recommendations for Other Services       Precautions / Restrictions Precautions Precautions: Knee Precaution Booklet Issued: Yes (comment) Precaution Comments: reviewed no pillow under knee Restrictions Weight Bearing Restrictions: Yes LLE Weight Bearing: Weight bearing as tolerated     Mobility  Bed Mobility Overal bed mobility: Modified Independent Bed Mobility: Supine to Sit     Supine to sit: Modified independent (Device/Increase time)     General bed mobility comments: used rail; HOB up    Transfers Overall transfer level: Modified  independent Equipment used: Rolling walker (2 wheels) Transfers: Sit to/from Stand Sit to Stand: Modified independent (Device/Increase time)           General transfer comment: good hand placement    Ambulation/Gait Ambulation/Gait assistance: Supervision Gait Distance (Feet): 110 Feet Assistive device: Rolling walker (2 wheels) Gait Pattern/deviations: Step-to pattern, Decreased step length - right, Decreased step length - left Gait velocity: decr     General Gait Details: steady, no loss of balance, HR 109 with walking   Stairs             Wheelchair Mobility    Modified Rankin (Stroke Patients Only)       Balance Overall balance assessment: Needs assistance Sitting-balance support: Feet supported, No upper extremity supported Sitting balance-Leahy Scale: Fair     Standing balance support: Reliant on assistive device for balance, During functional activity Standing balance-Leahy Scale: Poor                              Cognition Arousal/Alertness: Awake/alert Behavior During Therapy: WFL for tasks assessed/performed Overall Cognitive Status: Within Functional Limits for tasks assessed                                          Exercises Total Joint Exercises Ankle Circles/Pumps: AROM, Both, 10 reps Quad Sets: AROM, Strengthening, Both, 10 reps Short Arc Quad: AROM, Left, AAROM, 10 reps Heel Slides: AAROM, Left, 10 reps, Supine Hip ABduction/ADduction: AROM, AAROM, Left, 10 reps Straight Leg Raises: AAROM, Left, 5 reps Knee  Flexion: AAROM, Left, 10 reps, Seated Goniometric ROM: 5-60* AAROM L knee    General Comments        Pertinent Vitals/Pain Pain Assessment Pain Score: 3  Pain Location: L knee Pain Descriptors / Indicators: Grimacing, Sore Pain Intervention(s): Limited activity within patient's tolerance, Monitored during session, Ice applied, Repositioned    Home Living                           Prior Function            PT Goals (current goals can now be found in the care plan section) Acute Rehab PT Goals Patient Stated Goal: line dancing PT Goal Formulation: With patient Time For Goal Achievement: 12/31/21 Potential to Achieve Goals: Good Progress towards PT goals: Progressing toward goals    Frequency    7X/week      PT Plan Current plan remains appropriate    Co-evaluation              AM-PAC PT "6 Clicks" Mobility   Outcome Measure  Help needed turning from your back to your side while in a flat bed without using bedrails?: None Help needed moving from lying on your back to sitting on the side of a flat bed without using bedrails?: A Little Help needed moving to and from a bed to a chair (including a wheelchair)?: None Help needed standing up from a chair using your arms (e.g., wheelchair or bedside chair)?: None Help needed to walk in hospital room?: None Help needed climbing 3-5 steps with a railing? : A Little 6 Click Score: 22    End of Session Equipment Utilized During Treatment: Gait belt Activity Tolerance: Patient tolerated treatment well Patient left: in chair;with call bell/phone within reach;with nursing/sitter in room Nurse Communication: Mobility status PT Visit Diagnosis: Other abnormalities of gait and mobility (R26.89);Muscle weakness (generalized) (M62.81);Difficulty in walking, not elsewhere classified (R26.2);Pain Pain - Right/Left: Left Pain - part of body: Knee     Time: 0630-1601 PT Time Calculation (min) (ACUTE ONLY): 30 min  Charges:  $Gait Training: 8-22 mins $Therapeutic Exercise: 8-22 mins                  Blondell Reveal Kistler PT 12/20/2021  Acute Rehabilitation Services  Office [phone]

## 2021-12-20 NOTE — Progress Notes (Signed)
Consult PROGRESS NOTE    Jordan [name]  TKZ:[id number] DOB: 03/24/1951 DOA: 12/16/2021 PCP: Tobe Sos, MD     Brief Narrative:  h/o thyroids cancer s/p resection, on synthroid, h/o HTN, recent h/o LEFT loiwer extremity DVT on xarelto, finished last month, repeat left lower extremity venous Doppler showed resolution of clot per patient report . s/p Left  Total Knee Arthroplasty on 6/5, developed syncope on 6/7 pm, ekg showed she is in Afib/rvr, hospitalist called to consult  Subjective:  Remained in sinus rhythm, no chest pain, blood pressure slightly elevated Primary team placed  discharge order this morning  Assessment & Plan:  Principal Problem:   Osteoarthritis of left knee Active Problems:   Osteoarthritis of knee   Obesity (BMI 30-39.9)   New onset a-fib (HCC)   Postoperative hypothyroidism   Syncope    New onset of afib/RVR, syncope, had a syncope episode on 6/7 -Troponin negative, -converted to sinus rhythm with cardizem drip - seen by cardiology, now transition to beta-blocker, continue full dose anticoagulation with Xarelto (CHADSVASC score at least 3) -echocardiogram with preserved LVEF, grade 2 diastolic dysfunction ---TSH 3.246, keep mag>2, k>4 --cardiology consulted, input appreciated , cardiology has set up outpatient follow up     HTN home meds Diovan HCTZ held while on Cardizem drip Now off Cardizem drip,  started on beta-blocker, still have hypertension, resume home meds HCTZ -Close follow-up with cardiology, further meds adjustment per cardiology  Hypokalemia Replace and improved   Hypothyroidism -History of thyroid cancer status post thyroidectomy -On Synthroid supplement - TSH 3.2 F/u with pcp   S/p left knee Arthroplasty on 6/5 -Management per orthopedics       Body mass index is 38.17 kg/m.  Meet criteria for obesity Recommend outpatient sleep study         Objective: Vitals:   12/20/21 1000 12/20/21 1020 12/20/21 1100  12/20/21 1200  BP:  (!) 167/80    Pulse: 85 82 83 93  Resp: 13  14   Temp:      TempSrc:      SpO2: 93%  93% 99%  Weight:      Height:        Intake/Output Summary (Last 24 hours) at 12/20/2021 1313 Last data filed at 12/20/2021 0900 Gross per 24 hour  Intake 1692.52 ml  Output 720 ml  Net 972.52 ml   Filed Weights   12/16/21 0628 12/19/21 0500  Weight: 120.7 kg 122.4 kg    Examination:  General exam: alert, awake, communicative,calm, NAD Respiratory system: Clear to auscultation. Respiratory effort normal. Cardiovascular system:  RRR.  Gastrointestinal system: Abdomen is nondistended, soft and nontender.  Normal bowel sounds heard. Central nervous system: Alert and oriented. No focal neurological deficits. Extremities: Left knee postop changes Skin: No rashes, lesions or ulcers Psychiatry: Judgement and insight appear normal. Mood & affect appropriate.     Data Reviewed: I have personally reviewed  labs and visualized  imaging studies since the last encounter and formulate the plan        Scheduled Meds:  Chlorhexidine Gluconate Cloth  6 each Topical Daily   docusate sodium  100 mg Oral BID   levothyroxine  150 mcg Oral QAC breakfast   loratadine  10 mg Oral Daily   mouth rinse  15 mL Mouth Rinse BID   metoprolol succinate  25 mg Oral Daily   rivaroxaban  20 mg Oral Daily   tranexamic acid (CYKLOKAPRON) 2,000 mg in sodium chloride 0.9 % 50  mL Topical Application  0,981 mg Topical Once   Continuous Infusions:  methocarbamol (ROBAXIN) IV 100 mL/hr at 12/20/21 0433   promethazine (PHENERGAN) injection (IM or IVPB) 200 mL/hr at 12/20/21 0433     LOS: 3 days      Florencia Reasons, MD PhD FACP [facility]  Available via Epic secure chat 7am-7pm for nonurgent issues Please page for urgent issues To page the attending provider between 7A-7P or the covering provider during after hours 7P-7A, please log into the web site [email or web address] and access using universal  [place] password for that web site. If you do not have the password, please call the hospital operator.    12/20/2021, 1:13 PM

## 2021-12-20 NOTE — TOC Transition Note (Signed)
Transition of Care [facility]) - CM/SW Discharge Note   Patient Details  Name: Jordan [name] MRN: [id number] Date of Birth: 28-Jun-1951  [facility] CM/SW Contact:  Leeroy Cha, RN Phone Number: 12/20/2021, 8:52 AM   Clinical Narrative:    Patient dcd to return home with hhc through [facility] for p.t..   Final next level of care: [place] Barriers to Discharge: No Barriers Identified   Patient Goals and CMS Choice Patient states their goals for this hospitalization and ongoing recovery are:: Discharge home with HHPT through [facility] Medicare.gov Compare Post Acute Care list provided to:: Patient Choice offered to / list presented to : Patient  Discharge Placement                       Discharge Plan and Services In-house Referral: Clinical Social Work Discharge Planning Services: CM Consult Post Acute Care Choice: Home Health          DME Arranged: N/A DME Agency: NA       HH Arranged: PT [name] Agency: [place] Date [place]: 12/17/21   Representative spoke with at [place]: Levada Dy (referral was made through [facility])  Social Determinants of Health (SDOH) Interventions     Readmission Risk Interventions     No data to display

## 2021-12-20 NOTE — Plan of Care (Signed)

## 2021-12-20 NOTE — Progress Notes (Signed)
   Subjective: 4 Days Post-Op Procedure(s) (LRB): TOTAL KNEE ARTHROPLASTY (Left) Patient reports pain as mild.   Patient seen in rounds by Dr. Wynelle [name]. Patient is well, and has had no acute complaints or problems. Knee is doing well, progressed with physical therapy yesterday. Voiding without difficulty, denies chest pain or SOB. Plan is to go Home after hospital stay.  Objective: Vital signs in last 24 hours: Temp:  [98 F (36.7 C)-98.8 F (37.1 C)] 98.2 F (36.8 C) (06/09 0400) Pulse Rate:  [73-88] 79 (06/09 0600) Resp:  [10-26] 12 (06/09 0600) BP: (113-153)/(51-99) 137/84 (06/09 0600) SpO2:  [92 %-100 %] 95 % (06/09 0600)  Intake/Output from previous day:  Intake/Output Summary (Last 24 hours) at 12/20/2021 0659 Last data filed at 12/20/2021 0611 Gross per 24 hour  Intake 1781.34 ml  Output 270 ml  Net 1511.34 ml    Intake/Output this shift: Total I/O In: 1015.2 [P.O.:120; I.V.:895.2] Out: 270 [Urine:270]  Labs: Recent Labs    12/18/21 0310 12/18/21 1553 12/19/21 0318 12/20/21 0323  HGB 9.2* 10.4* 8.8* 8.4*   Recent Labs    12/19/21 0318 12/20/21 0323  WBC 8.6 8.1  RBC 3.26* 3.10*  HCT 27.4* 26.9*  PLT 145* 159   Recent Labs    12/19/21 0318 12/20/21 0323  NA 136 137  K 3.3* 3.9  CL 100 106  CO2 26 24  BUN 19 19  CREATININE 0.96 0.74  GLUCOSE 114* 104*  CALCIUM 8.0* 7.4*   No results for input(s): "LABPT", "INR" in the last 72 hours.  Exam: General - Patient is Alert and Oriented Extremity - Neurologically intact Neurovascular intact Sensation intact distally Dorsiflexion/Plantar flexion intact Dressing/Incision - clean, dry, no drainage Motor Function - intact, moving foot and toes well on exam.   Past Medical History:  Diagnosis Date   Anemia    Arthritis    Cancer ([place])    thyroid removed   COVID    08-02-20   DVT (deep venous thrombosis) (HCC)    L leg   Hypertension    Hypothyroidism    PONV (postoperative nausea and vomiting)     Pre-diabetes    per MD notes on chart pt. denies    Assessment/Plan: 4 Days Post-Op Procedure(s) (LRB): TOTAL KNEE ARTHROPLASTY (Left) Principal Problem:   Osteoarthritis of left knee Active Problems:   Osteoarthritis of knee   Obesity (BMI 30-39.9)   New onset a-fib (HCC)   Postoperative hypothyroidism   Syncope  Estimated body mass index is 38.72 kg/m as calculated from the following:   Height as of this encounter: '5\' 10"'$  (1.778 m).   Weight as of this encounter: 122.4 kg. Up with therapy  DVT Prophylaxis - Xarelto Weight-bearing as tolerated  Plan for discharge later today if medically cleared with HHPT.  Theresa Duty, PA-C Orthopedic Surgery [phone] 12/20/2021, 6:59 AM

## 2021-12-25 NOTE — Discharge Summary (Signed)
[name] MRN: [id number] DOB/AGE: 69-Sep-1952 69 y.o.  Admit date: 12/16/2021 Discharge date: 12/20/2021  Admission Diagnoses:  Principal Problem:   Osteoarthritis of left knee Active Problems:   Osteoarthritis of knee   Obesity (BMI 30-39.9)   New onset a-fib ([place])   Postoperative hypothyroidism   Syncope   Discharge Diagnoses:  Same  Past Medical History:  Diagnosis Date   Anemia    Arthritis    Cancer ([place])    thyroid removed   COVID    08-02-20   DVT (deep venous thrombosis) ([facility])    L leg   Hypertension    Hypothyroidism    PONV (postoperative nausea and vomiting)    Pre-diabetes    per MD notes on chart pt. denies    Surgeries: Procedure(s): TOTAL KNEE ARTHROPLASTY on 12/16/2021   Consultants: Treatment Team:  Florencia Reasons, MD  Discharged Condition: Improved  Hospital Course: Jordan [name] is an 72 y.o. female who was admitted 12/16/2021 for operative treatment ofOsteoarthritis of left knee. Patient has severe unremitting pain that affects sleep, daily activities, and work/hobbies. After pre-op clearance the patient was taken to the operating room on 12/16/2021 and underwent  Procedure(s): TOTAL KNEE ARTHROPLASTY.    Patient was given perioperative antibiotics:  Anti-infectives (From admission, onward)    Start     Dose/Rate Route Frequency Ordered Stop   12/16/21 1400  ceFAZolin (ANCEF) IVPB 2g/100 mL premix        2 g 200 mL/hr over 30 Minutes Intravenous Every 6 hours 12/16/21 1014 12/16/21 2027   12/16/21 0600  ceFAZolin (ANCEF) IVPB 3g/100 mL premix        3 g 200 mL/hr over 30 Minutes Intravenous On call to O.R. 12/16/21 0541 12/16/21 0830        Patient was given sequential compression devices, early ambulation, and chemoprophylaxis to prevent DVT. Hospitalist service and cardiology were consulted due to new onset atrial fibrillation. Patient was cleared medically after being converted back to sinus rhythm with outpatient cardiology  follow up.  Patient benefited maximally from hospital stay and there were no complications.    Recent vital signs: No data found.   Recent laboratory studies: No results for input(s): "WBC", "HGB", "HCT", "PLT", "NA", "K", "CL", "CO2", "BUN", "CREATININE", "GLUCOSE", "INR", "CALCIUM" in the last 72 hours.  Invalid input(s): "PT", "2"   Discharge Medications:   Allergies as of 12/20/2021       Reactions   Codeine Nausea And Vomiting   Gabapentin Nausea And Vomiting        Medication List     STOP taking these medications    meloxicam 15 MG tablet Commonly known as: MOBIC       TAKE these medications    CALCIUM 600/VITAMIN D3 PO Take 1 tablet by mouth daily.   cetirizine 10 MG tablet Commonly known as: ZYRTEC Take 10 mg by mouth daily as needed for allergies.   cholecalciferol 25 MCG (1000 UNIT) tablet Commonly known as: VITAMIN D3 Take 1,000 Units by mouth daily.   HYDROmorphone 2 MG tablet Commonly known as: DILAUDID Take 1-2 tablets (2-4 mg total) by mouth every 6 (six) hours as needed for severe pain.   levothyroxine 150 MCG tablet Commonly known as: SYNTHROID Take 150 mcg by mouth daily before breakfast.   magnesium oxide 400 MG tablet Commonly known as: MAG-OX Take 800 mg by mouth 2 (two) times daily.   methocarbamol 500 MG tablet Commonly known as: ROBAXIN Take 1 tablet (500  mg total) by mouth every 6 (six) hours as needed for muscle spasms.   metoprolol succinate 25 MG 24 hr tablet Commonly known as: TOPROL-XL Take 1 tablet (25 mg total) by mouth daily.   rivaroxaban 20 MG Tabs tablet Commonly known as: XARELTO Take 1 tablet (20 mg total) by mouth daily.   traMADol 50 MG tablet Commonly known as: ULTRAM Take 1-2 tablets (50-100 mg total) by mouth every 6 (six) hours as needed for moderate pain.   valsartan-hydrochlorothiazide 160-25 MG tablet Commonly known as: DIOVAN-HCT Take 1 tablet by mouth daily.               Discharge  Care Instructions  (From admission, onward)           Start     Ordered   12/18/21 0000  Weight bearing as tolerated        12/18/21 0815   12/18/21 0000  Change dressing       Comments: You may remove the bulky bandage (ACE wrap and gauze) two days after surgery. You will have an adhesive waterproof bandage underneath. Leave this in place until your first follow-up appointment.   12/18/21 0815            Diagnostic Studies: ECHOCARDIOGRAM COMPLETE  Result Date: 12/19/2021    ECHOCARDIOGRAM REPORT   Patient Name:   Jordan [name] Date of Exam: 12/19/2021 Medical Rec #:  [id number]       Height:       70.0 in Accession #:    [id number]      Weight:       269.8 lb Date of Birth:  [birth year]        BSA:          2.371 m Patient Age:    81 years        BP:           120/75 mmHg Patient Gender: F               HR:           71 bpm. Exam Location:  Inpatient Procedure: 2D Echo, Cardiac Doppler and Color Doppler Indications:    Afib  History:        Patient has no prior history of Echocardiogram examinations.                 Risk Factors:Hypertension. Hypothyroidism.  Sonographer:    Joette Catching RCS Referring Phys: [phone] [place]  1. Left ventricular ejection fraction, by estimation, is 60 to 65%. The left ventricle has normal function. The left ventricle has no regional wall motion abnormalities. Left ventricular diastolic parameters are consistent with Grade II diastolic dysfunction (pseudonormalization). Elevated left ventricular end-diastolic pressure.  2. Right ventricular systolic function is normal. The right ventricular size is normal. There is moderately elevated pulmonary artery systolic pressure.  3. Left atrial size was severely dilated.  4. The mitral valve is normal in structure. Trivial mitral valve regurgitation. No evidence of mitral stenosis.  5. The aortic valve is tricuspid. Aortic valve regurgitation is not visualized. No aortic stenosis is present.  6. The inferior  vena cava is normal in size with <50% respiratory variability, suggesting right atrial pressure of 8 mmHg. FINDINGS  Left Ventricle: Left ventricular ejection fraction, by estimation, is 60 to 65%. The left ventricle has normal function. The left ventricle has no regional wall motion abnormalities. The left ventricular internal cavity size was normal in size. There is  no left ventricular hypertrophy. Left ventricular diastolic parameters are consistent with Grade II diastolic dysfunction (pseudonormalization). Elevated left ventricular end-diastolic pressure. Right Ventricle: The right ventricular size is normal. No increase in right ventricular wall thickness. Right ventricular systolic function is normal. There is moderately elevated pulmonary artery systolic pressure. The tricuspid regurgitant velocity is 3.33 m/s, and with an assumed right atrial pressure of 8 mmHg, the estimated right ventricular systolic pressure is 63.0 mmHg. Left Atrium: Left atrial size was severely dilated. Right Atrium: Right atrial size was normal in size. Pericardium: There is no evidence of pericardial effusion. Mitral Valve: The mitral valve is normal in structure. Trivial mitral valve regurgitation. No evidence of mitral valve stenosis. Tricuspid Valve: The tricuspid valve is normal in structure. Tricuspid valve regurgitation is mild . No evidence of tricuspid stenosis. Aortic Valve: The aortic valve is tricuspid. Aortic valve regurgitation is not visualized. No aortic stenosis is present. Aortic valve mean gradient measures 9.0 mmHg. Aortic valve peak gradient measures 15.7 mmHg. Aortic valve area, by VTI measures 2.01  cm. Pulmonic Valve: The pulmonic valve was normal in structure. Pulmonic valve regurgitation is not visualized. No evidence of pulmonic stenosis. Aorta: The aortic root is normal in size and structure. Venous: The inferior vena cava is normal in size with less than 50% respiratory variability, suggesting right  atrial pressure of 8 mmHg. IAS/Shunts: No atrial level shunt detected by color flow Doppler.  LEFT VENTRICLE PLAX 2D LVIDd:         4.70 cm   Diastology LVIDs:         3.40 cm   LV e' medial:    5.87 cm/s LV PW:         0.80 cm   LV E/e' medial:  15.8 LV IVS:        0.80 cm   LV e' lateral:   8.05 cm/s LVOT diam:     2.00 cm   LV E/e' lateral: 11.6 LV SV:         84 LV SV Index:   36 LVOT Area:     3.14 cm  RIGHT VENTRICLE             IVC RV Basal diam:  3.70 cm     IVC diam: 1.90 cm RV Mid diam:    2.70 cm RV S prime:     12.90 cm/s TAPSE (M-mode): 1.9 cm LEFT ATRIUM              Index        RIGHT ATRIUM           Index LA diam:        4.00 cm  1.69 cm/m   RA Area:     20.00 cm LA Vol (A2C):   87.6 ml  36.95 ml/m  RA Volume:   60.20 ml  25.39 ml/m LA Vol (A4C):   103.0 ml 43.45 ml/m LA Biplane Vol: 96.2 ml  40.58 ml/m  AORTIC VALVE                     PULMONIC VALVE AV Area (Vmax):    1.89 cm      PV Vmax:          1.11 m/s AV Area (Vmean):   2.10 cm      PV Peak grad:     4.9 mmHg AV Area (VTI):     2.01 cm      PR End Diast Vel:  6.05 msec AV Vmax:           198.00 cm/s AV Vmean:          140.000 cm/s AV VTI:            0.418 m AV Peak Grad:      15.7 mmHg AV Mean Grad:      9.0 mmHg LVOT Vmax:         119.00 cm/s LVOT Vmean:        93.400 cm/s LVOT VTI:          0.268 m LVOT/AV VTI ratio: 0.64  AORTA Ao Root diam: 2.70 cm Ao Asc diam:  3.30 cm MITRAL VALVE               TRICUSPID VALVE MV Area (PHT): 3.85 cm    TR Peak grad:   44.4 mmHg MV Decel Time: 197 msec    TR Vmax:        333.00 cm/s MV E velocity: 93.00 cm/s MV A velocity: 63.40 cm/s  SHUNTS MV E/A ratio:  1.47        Systemic VTI:  0.27 m                            Systemic Diam: 2.00 cm Skeet Latch MD Electronically signed by Skeet Latch MD Signature Date/Time: 12/19/2021/5:42:59 PM    Final     Disposition: Discharge disposition: 01-Home or Self Care       Discharge Instructions     Call MD / Call 911   Complete by: As  directed    If you experience chest pain or shortness of breath, CALL 911 and be transported to the hospital emergency room.  If you develope a fever above 101 F, pus (white drainage) or increased drainage or redness at the wound, or calf pain, call your surgeon's office.   Change dressing   Complete by: As directed    You may remove the bulky bandage (ACE wrap and gauze) two days after surgery. You will have an adhesive waterproof bandage underneath. Leave this in place until your first follow-up appointment.   Constipation Prevention   Complete by: As directed    Drink plenty of fluids.  Prune juice may be helpful.  You may use a stool softener, such as Colace (over the counter) 100 mg twice a day.  Use MiraLax (over the counter) for constipation as needed.   Diet - low sodium heart healthy   Complete by: As directed    Do not put a pillow under the knee. Place it under the heel.   Complete by: As directed    Driving restrictions   Complete by: As directed    No driving for two weeks   Post-operative opioid taper instructions:   Complete by: As directed    POST-OPERATIVE OPIOID TAPER INSTRUCTIONS: It is important to wean off of your opioid medication as soon as possible. If you do not need pain medication after your surgery it is ok to stop day one. Opioids include: Codeine, Hydrocodone(Norco, Vicodin), Oxycodone(Percocet, oxycontin) and hydromorphone amongst others.  Long term and even short term use of opiods can cause: Increased pain response Dependence Constipation Depression Respiratory depression And more.  Withdrawal symptoms can include Flu like symptoms Nausea, vomiting And more Techniques to manage these symptoms Hydrate well Eat regular healthy meals Stay active Use relaxation techniques(deep breathing, meditating, yoga) Do Not substitute Alcohol to help  with tapering If you have been on opioids for less than two weeks and do not have pain than it is ok to stop all  together.  Plan to wean off of opioids This plan should start within one week post op of your joint replacement. Maintain the same interval or time between taking each dose and first decrease the dose.  Cut the total daily intake of opioids by one tablet each day Next start to increase the time between doses. The last dose that should be eliminated is the evening dose.      TED hose   Complete by: As directed    Use stockings (TED hose) for three weeks on both leg(s).  You may remove them at night for sleeping.   Weight bearing as tolerated   Complete by: As directed         Follow-up Information     Aluisio, Pilar Plate, MD. Schedule an appointment as soon as possible for a visit in 2 week(s).   Specialty: Orthopedic Surgery Contact information: [address] [place] [address] [place] [address] [phone]         [facility] Follow up.   Why: [facility] will provide PT in the home. Contact information: [place] [phone] [place], NP Follow up on 01/08/2022.   Specialties: Nurse Practitioner, Cardiology Why: 11AM for your A fib clinic follow up Contact information: [place] [address] [phone]                  Signed: Theresa Duty 12/25/2021, 2:46 PM

## 2022-01-08 ENCOUNTER — Ambulatory Visit (HOSPITAL_COMMUNITY): Payer: Medicare Other | Admitting: Nurse Practitioner

## 2022-08-21 ENCOUNTER — Encounter (HOSPITAL_COMMUNITY): Payer: Self-pay | Admitting: *Deleted
